# Patient Record
Sex: Female | Born: 2000 | Race: Black or African American | Hispanic: No | Marital: Single | Smoking: Former smoker
Health system: Southern US, Community
[De-identification: ages and names within clinical notes are randomized; demographics above are authoritative.]

## PROBLEM LIST (undated history)

## (undated) DIAGNOSIS — J45909 Unspecified asthma, uncomplicated: Secondary | ICD-10-CM

## (undated) DIAGNOSIS — E611 Iron deficiency: Secondary | ICD-10-CM

## (undated) DIAGNOSIS — S42309A Unspecified fracture of shaft of humerus, unspecified arm, initial encounter for closed fracture: Secondary | ICD-10-CM

## (undated) DIAGNOSIS — Z889 Allergy status to unspecified drugs, medicaments and biological substances status: Secondary | ICD-10-CM

## (undated) DIAGNOSIS — D649 Anemia, unspecified: Secondary | ICD-10-CM

## (undated) HISTORY — PX: ARM HARDWARE REMOVAL: SUR1122

---
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
## 2020-01-13 ENCOUNTER — Ambulatory Visit (INDEPENDENT_AMBULATORY_CARE_PROVIDER_SITE_OTHER): Payer: Medicaid Other | Admitting: Family Medicine

## 2020-01-13 ENCOUNTER — Other Ambulatory Visit: Payer: Self-pay

## 2020-01-13 ENCOUNTER — Other Ambulatory Visit (HOSPITAL_COMMUNITY)
Admission: RE | Admit: 2020-01-13 | Discharge: 2020-01-13 | Disposition: A | Discharge status: Home / Self Care | Payer: Medicaid Other | Source: Ambulatory Visit | Attending: Family Medicine | Admitting: Family Medicine

## 2020-01-13 VITALS — BP 106/72 | HR 108 | Wt 256.0 lb

## 2020-01-13 DIAGNOSIS — N898 Other specified noninflammatory disorders of vagina: Secondary | ICD-10-CM | POA: Insufficient documentation

## 2020-01-13 DIAGNOSIS — Z23 Encounter for immunization: Secondary | ICD-10-CM | POA: Diagnosis not present

## 2020-01-13 DIAGNOSIS — O0993 Supervision of high risk pregnancy, unspecified, third trimester: Secondary | ICD-10-CM

## 2020-01-13 DIAGNOSIS — O0990 Supervision of high risk pregnancy, unspecified, unspecified trimester: Secondary | ICD-10-CM

## 2020-01-13 DIAGNOSIS — Z3A28 28 weeks gestation of pregnancy: Secondary | ICD-10-CM

## 2020-01-13 LAB — CBC
Hematocrit: 33 % — ABNORMAL LOW (ref 34.0–46.6)
Hemoglobin: 11.5 g/dL (ref 11.1–15.9)
MCH: 31.7 pg (ref 26.6–33.0)
MCHC: 34.8 g/dL (ref 31.5–35.7)
MCV: 91 fL (ref 79–97)
Platelets: 253 10*3/uL (ref 150–450)
RBC: 3.63 x10E6/uL — ABNORMAL LOW (ref 3.77–5.28)
RDW: 12.2 % (ref 11.7–15.4)
WBC: 10.3 10*3/uL (ref 3.4–10.8)

## 2020-01-13 MED ORDER — PREPLUS 27-1 MG PO TABS: 1.0000 | ORAL_TABLET | Freq: Every day | ORAL | 13 refills | Status: DC

## 2020-01-13 NOTE — Progress Notes (Signed)
   PRENATAL VISIT NOTE  Subjective:  Briana Curry is a 19 y.o. G1P0 at [redacted]w[redacted]d being seen today for ongoing prenatal care.  She is currently monitored for the following issues for this low-risk pregnancy and has BMI 40.0-44.9, adult ([place]); Rash and other nonspecific skin eruption; Elevated blood pressure reading; Urticaria; Lactose intolerance; Adverse food reaction; Other allergic rhinitis; Obesity (BMI 30-39.9); Obesity in pregnancy; and Supervision of high risk pregnancy, antepartum on their problem list.  Patient reports vaginal irritation.  Contractions: Irregular. Vag. Bleeding: None.  Movement: Present. Denies leaking of fluid.   The following portions of the patient's history were reviewed and updated as appropriate: allergies, current medications, past family history, past medical history, past social history, past surgical history and problem list.   Objective:   Vitals:   01/13/20 0934  BP: 106/72  Pulse: (!) 108  Weight: 256 lb (116.1 kg)    Fetal Status: Fetal Heart Rate (bpm): 135 Fundal Height: 30 cm Movement: Present     General:  Alert, oriented and cooperative. Patient is in no acute distress.  Skin: Skin is warm and dry. No rash noted.   Cardiovascular: Normal heart rate noted  Respiratory: Normal respiratory effort, no problems with respiration noted  Abdomen: Soft, gravid, appropriate for gestational age.  Pain/Pressure: Present     Pelvic: Cervical exam deferred        Extremities: Normal range of motion.  Edema: None  Mental Status: Normal mood and affect. Normal behavior. Normal judgment and thought content.   Assessment and Plan:  Pregnancy: G1P0 at [redacted]w[redacted]d 1. Supervision of high risk pregnancy, antepartum 28 wk labs - Glucose Tolerance, 2 Hours w/1 Hour - RPR - CBC - HIV Antibody (routine testing w rflx) - Tdap vaccine greater than or equal to 7yo IM - Prenatal Vit-Fe Fumarate-FA (PREPLUS) 27-1 MG TABS; Take 1 tablet by mouth daily.  Dispense: 30  tablet; Refill: 13  2. Vaginal discharge Check and treat accordingly - Cervicovaginal ancillary only( [place])  Preterm labor symptoms and general obstetric precautions including but not limited to vaginal bleeding, contractions, leaking of fluid and fetal movement were reviewed in detail with the patient. Please refer to After Visit Summary for other counseling recommendations.   Return in 2 weeks (on 01/27/2020) for [facility], virtual.  Future Appointments  Date Time Provider [place]  01/26/2020  2:30 PM [facility] NURSE [facility]  01/26/2020  2:30 PM [facility] US3 [facility]    Donnamae Jude, MD

## 2020-01-13 NOTE — Patient Instructions (Addendum)
[email or web address] - website for childbirth classes  Breastfeeding  Choosing to breastfeed is one of the best decisions you can make for yourself and your baby. A change in hormones during pregnancy causes your breasts to make breast milk in your milk-producing glands. Hormones prevent breast milk from being released before your baby is born. They also prompt milk flow after birth. Once breastfeeding has begun, thoughts of your baby, as well as his or her sucking or crying, can stimulate the release of milk from your milk-producing glands. Benefits of breastfeeding Research shows that breastfeeding offers many health benefits for infants and mothers. It also offers a cost-free and convenient way to feed your baby. For your baby  Your first milk (colostrum) helps your baby's digestive system to function better.  Special cells in your milk (antibodies) help your baby to fight off infections.  Breastfed babies are less likely to develop asthma, allergies, obesity, or type 2 diabetes. They are also at lower risk for sudden infant death syndrome (SIDS).  Nutrients in breast milk are better able to meet your baby's needs compared to infant formula.  Breast milk improves your baby's brain development. For you  Breastfeeding helps to create a very special bond between you and your baby.  Breastfeeding is convenient. Breast milk costs nothing and is always available at the correct temperature.  Breastfeeding helps to burn calories. It helps you to lose the weight that you gained during pregnancy.  Breastfeeding makes your uterus return faster to its size before pregnancy. It also slows bleeding (lochia) after you give birth.  Breastfeeding helps to lower your risk of developing type 2 diabetes, osteoporosis, rheumatoid arthritis, cardiovascular disease, and breast, ovarian, uterine, and endometrial cancer later in life. Breastfeeding basics Starting breastfeeding  Find a comfortable place to  sit or lie down, with your neck and back well-supported.  Place a pillow or a rolled-up blanket under your baby to bring him or her to the level of your breast (if you are seated). Nursing pillows are specially designed to help support your arms and your baby while you breastfeed.  Make sure that your baby's tummy (abdomen) is facing your abdomen.  Gently massage your breast. With your fingertips, massage from the outer edges of your breast inward toward the nipple. This encourages milk flow. If your milk flows slowly, you may need to continue this action during the feeding.  Support your breast with 4 fingers underneath and your thumb above your nipple (make the letter "C" with your hand). Make sure your fingers are well away from your nipple and your baby's mouth.  Stroke your baby's lips gently with your finger or nipple.  When your baby's mouth is open wide enough, quickly bring your baby to your breast, placing your entire nipple and as much of the areola as possible into your baby's mouth. The areola is the colored area around your nipple. ? More areola should be visible above your baby's upper lip than below the lower lip. ? Your baby's lips should be opened and extended outward (flanged) to ensure an adequate, comfortable latch. ? Your baby's tongue should be between his or her lower gum and your breast.  Make sure that your baby's mouth is correctly positioned around your nipple (latched). Your baby's lips should create a seal on your breast and be turned out (everted).  It is common for your baby to suck about 2-3 minutes in order to start the flow of breast milk. Latching Teaching your baby how  to latch onto your breast properly is very important. An improper latch can cause nipple pain, decreased milk supply, and poor weight gain in your baby. Also, if your baby is not latched onto your nipple properly, he or she may swallow some air during feeding. This can make your baby fussy.  Burping your baby when you switch breasts during the feeding can help to get rid of the air. However, teaching your baby to latch on properly is still the best way to prevent fussiness from swallowing air while breastfeeding. Signs that your baby has successfully latched onto your nipple  Silent tugging or silent sucking, without causing you pain. Infant's lips should be extended outward (flanged).  Swallowing heard between every 3-4 sucks once your milk has started to flow (after your let-down milk reflex occurs).  Muscle movement above and in front of his or her ears while sucking. Signs that your baby has not successfully latched onto your nipple  Sucking sounds or smacking sounds from your baby while breastfeeding.  Nipple pain. If you think your baby has not latched on correctly, slip your finger into the corner of your baby's mouth to break the suction and place it between your baby's gums. Attempt to start breastfeeding again. Signs of successful breastfeeding Signs from your baby  Your baby will gradually decrease the number of sucks or will completely stop sucking.  Your baby will fall asleep.  Your baby's body will relax.  Your baby will retain a small amount of milk in his or her mouth.  Your baby will let go of your breast by himself or herself. Signs from you  Breasts that have increased in firmness, weight, and size 1-3 hours after feeding.  Breasts that are softer immediately after breastfeeding.  Increased milk volume, as well as a change in milk consistency and color by the fifth day of breastfeeding.  Nipples that are not sore, cracked, or bleeding. Signs that your baby is getting enough milk  Wetting at least 1-2 diapers during the first 24 hours after birth.  Wetting at least 5-6 diapers every 24 hours for the first week after birth. The urine should be clear or pale yellow by the age of 5 days.  Wetting 6-8 diapers every 24 hours as your baby continues to  grow and develop.  At least 3 stools in a 24-hour period by the age of 5 days. The stool should be soft and yellow.  At least 3 stools in a 24-hour period by the age of 7 days. The stool should be seedy and yellow.  No loss of weight greater than 10% of birth weight during the first 3 days of life.  Average weight gain of 4-7 oz (113-198 g) per week after the age of 4 days.  Consistent daily weight gain by the age of 5 days, without weight loss after the age of 2 weeks. After a feeding, your baby may spit up a small amount of milk. This is normal. Breastfeeding frequency and duration Frequent feeding will help you make more milk and can prevent sore nipples and extremely full breasts (breast engorgement). Breastfeed when you feel the need to reduce the fullness of your breasts or when your baby shows signs of hunger. This is called "breastfeeding on demand." Signs that your baby is hungry include:  Increased alertness, activity, or restlessness.  Movement of the head from side to side.  Opening of the mouth when the corner of the mouth or cheek is stroked (rooting).  Increased sucking sounds, smacking lips, cooing, sighing, or squeaking.  Hand-to-mouth movements and sucking on fingers or hands.  Fussing or crying. Avoid introducing a pacifier to your baby in the first 4-6 weeks after your baby is born. After this time, you may choose to use a pacifier. Research has shown that pacifier use during the first year of a baby's life decreases the risk of sudden infant death syndrome (SIDS). Allow your baby to feed on each breast as long as he or she wants. When your baby unlatches or falls asleep while feeding from the first breast, offer the second breast. Because newborns are often sleepy in the first few weeks of life, you may need to awaken your baby to get him or her to feed. Breastfeeding times will vary from baby to baby. However, the following rules can serve as a guide to help you make  sure that your baby is properly fed:  Newborns (babies 75 weeks of age or younger) may breastfeed every 1-3 hours.  Newborns should not go without breastfeeding for longer than 3 hours during the day or 5 hours during the night.  You should breastfeed your baby a minimum of 8 times in a 24-hour period. Breast milk pumping     Pumping and storing breast milk allows you to make sure that your baby is exclusively fed your breast milk, even at times when you are unable to breastfeed. This is especially important if you go back to work while you are still breastfeeding, or if you are not able to be present during feedings. Your lactation consultant can help you find a method of pumping that works best for you and give you guidelines about how long it is safe to store breast milk. Caring for your breasts while you breastfeed Nipples can become dry, cracked, and sore while breastfeeding. The following recommendations can help keep your breasts moisturized and healthy:  Avoid using soap on your nipples.  Wear a supportive bra designed especially for nursing. Avoid wearing underwire-style bras or extremely tight bras (sports bras).  Air-dry your nipples for 3-4 minutes after each feeding.  Use only cotton bra pads to absorb leaked breast milk. Leaking of breast milk between feedings is normal.  Use lanolin on your nipples after breastfeeding. Lanolin helps to maintain your skin's normal moisture barrier. Pure lanolin is not harmful (not toxic) to your baby. You may also hand express a few drops of breast milk and gently massage that milk into your nipples and allow the milk to air-dry. In the first few weeks after giving birth, some women experience breast engorgement. Engorgement can make your breasts feel heavy, warm, and tender to the touch. Engorgement peaks within 3-5 days after you give birth. The following recommendations can help to ease engorgement:  Completely empty your breasts while  breastfeeding or pumping. You may want to start by applying warm, moist heat (in the shower or with warm, water-soaked hand towels) just before feeding or pumping. This increases circulation and helps the milk flow. If your baby does not completely empty your breasts while breastfeeding, pump any extra milk after he or she is finished.  Apply ice packs to your breasts immediately after breastfeeding or pumping, unless this is too uncomfortable for you. To do this: ? Put ice in a plastic bag. ? Place a towel between your skin and the bag. ? Leave the ice on for 20 minutes, 2-3 times a day.  Make sure that your baby is latched on  and positioned properly while breastfeeding. If engorgement persists after 48 hours of following these recommendations, contact your health care provider or a Science writer. Overall health care recommendations while breastfeeding  Eat 3 healthy meals and 3 snacks every day. Well-nourished mothers who are breastfeeding need an additional 450-500 calories a day. You can meet this requirement by increasing the amount of a balanced diet that you eat.  Drink enough water to keep your urine pale yellow or clear.  Rest often, relax, and continue to take your prenatal vitamins to prevent fatigue, stress, and low vitamin and mineral levels in your body (nutrient deficiencies).  Do not use any products that contain nicotine or tobacco, such as cigarettes and e-cigarettes. Your baby may be harmed by chemicals from cigarettes that pass into breast milk and exposure to secondhand smoke. If you need help quitting, ask your health care provider.  Avoid alcohol.  Do not use illegal drugs or marijuana.  Talk with your health care provider before taking any medicines. These include over-the-counter and prescription medicines as well as vitamins and herbal supplements. Some medicines that may be harmful to your baby can pass through breast milk.  It is possible to become pregnant  while breastfeeding. If birth control is desired, ask your health care provider about options that will be safe while breastfeeding your baby. Where to find more information: [employer]: www.llli.org Contact a health care provider if:  You feel like you want to stop breastfeeding or have become frustrated with breastfeeding.  Your nipples are cracked or bleeding.  Your breasts are red, tender, or warm.  You have: ? Painful breasts or nipples. ? A swollen area on either breast. ? A fever or chills. ? Nausea or vomiting. ? Drainage other than breast milk from your nipples.  Your breasts do not become full before feedings by the fifth day after you give birth.  You feel sad and depressed.  Your baby is: ? Too sleepy to eat well. ? Having trouble sleeping. ? More than 42 week old and wetting fewer than 6 diapers in a 24-hour period. ? Not gaining weight by 78 days of age.  Your baby has fewer than 3 stools in a 24-hour period.  Your baby's skin or the white parts of his or her eyes become yellow. Get help right away if:  Your baby is overly tired (lethargic) and does not want to wake up and feed.  Your baby develops an unexplained fever. Summary  Breastfeeding offers many health benefits for infant and mothers.  Try to breastfeed your infant when he or she shows early signs of hunger.  Gently tickle or stroke your baby's lips with your finger or nipple to allow the baby to open his or her mouth. Bring the baby to your breast. Make sure that much of the areola is in your baby's mouth. Offer one side and burp the baby before you offer the other side.  Talk with your health care provider or lactation consultant if you have questions or you face problems as you breastfeed. This information is not intended to replace advice given to you by your health care provider. Make sure you discuss any questions you have with your health care provider. Document Revised:  10/30/2017 Document Reviewed: 09/06/2016 Elsevier Patient Education  [place].

## 2020-01-14 LAB — RPR: RPR Ser Ql: NONREACTIVE

## 2020-01-14 LAB — CERVICOVAGINAL ANCILLARY ONLY
Bacterial Vaginitis (gardnerella): POSITIVE — AB
Candida Glabrata: NEGATIVE
Candida Vaginitis: NEGATIVE
Chlamydia: NEGATIVE
Comment: NEGATIVE
Comment: NEGATIVE
Comment: NEGATIVE
Comment: NEGATIVE
Comment: NEGATIVE
Comment: NORMAL
Neisseria Gonorrhea: NEGATIVE
Trichomonas: NEGATIVE

## 2020-01-14 LAB — HIV ANTIBODY (ROUTINE TESTING W REFLEX): HIV Screen 4th Generation wRfx: NONREACTIVE

## 2020-01-14 LAB — GLUCOSE TOLERANCE, 2 HOURS W/ 1HR
Glucose, 1 hour: 113 mg/dL (ref 65–179)
Glucose, 2 hour: 78 mg/dL (ref 65–152)
Glucose, Fasting: 82 mg/dL (ref 65–91)

## 2020-01-14 MED ORDER — METRONIDAZOLE 500 MG PO TABS
500.0000 mg | ORAL_TABLET | Freq: Two times a day (BID) | ORAL | 0 refills | Status: DC
Start: 2020-01-14 — End: 2020-03-09

## 2020-01-14 NOTE — Addendum Note (Signed)
Addended by: Donnamae Jude on: 01/14/2020 02:42 PM   Modules accepted: Orders

## 2020-01-26 ENCOUNTER — Other Ambulatory Visit: Payer: Self-pay | Admitting: *Deleted

## 2020-01-26 ENCOUNTER — Ambulatory Visit: Payer: Medicaid Other | Admitting: *Deleted

## 2020-01-26 ENCOUNTER — Ambulatory Visit (HOSPITAL_COMMUNITY): Payer: Medicaid Other | Attending: Maternal & Fetal Medicine

## 2020-01-26 ENCOUNTER — Other Ambulatory Visit: Payer: Self-pay

## 2020-01-26 VITALS — BP 137/76 | HR 116

## 2020-01-26 DIAGNOSIS — O99213 Obesity complicating pregnancy, third trimester: Secondary | ICD-10-CM | POA: Diagnosis not present

## 2020-01-26 DIAGNOSIS — O99210 Obesity complicating pregnancy, unspecified trimester: Secondary | ICD-10-CM

## 2020-01-26 DIAGNOSIS — Z362 Encounter for other antenatal screening follow-up: Secondary | ICD-10-CM

## 2020-01-26 DIAGNOSIS — E669 Obesity, unspecified: Secondary | ICD-10-CM

## 2020-01-26 DIAGNOSIS — Z3A30 30 weeks gestation of pregnancy: Secondary | ICD-10-CM

## 2020-01-27 ENCOUNTER — Telehealth (INDEPENDENT_AMBULATORY_CARE_PROVIDER_SITE_OTHER): Payer: Medicaid Other | Admitting: Obstetrics and Gynecology

## 2020-01-27 ENCOUNTER — Telehealth: Payer: Self-pay | Admitting: *Deleted

## 2020-01-27 DIAGNOSIS — Z91199 Patient's noncompliance with other medical treatment and regimen due to unspecified reason: Secondary | ICD-10-CM

## 2020-01-27 DIAGNOSIS — Z5329 Procedure and treatment not carried out because of patient's decision for other reasons: Secondary | ICD-10-CM

## 2020-01-27 NOTE — Progress Notes (Signed)
I connected with  Briana Curry on 01/27/20 at  3:15 PM EDT by telephone and verified that I am speaking with the correct person using two identifiers.   I discussed the limitations, risks, security and privacy concerns of performing an evaluation and management service by telephone and the availability of in person appointments. I also discussed with the patient that there may be a patient responsible charge related to this service. The patient expressed understanding and agreed to proceed.  Crosby Oyster, RN 01/27/2020  3:16 PM

## 2020-01-27 NOTE — Telephone Encounter (Signed)
Called pt to help get video visit started, left message for pt to call office

## 2020-01-31 NOTE — Progress Notes (Signed)
Patient did not keep her televisit OB appointment for 01/27/2020. Patient did not appear on MyChart and she was called but there was no answer when called by the doctor. We will attempt to reschedule her visit.   Durene Romans MD Attending Center for [employer] farm manager)

## 2020-02-10 ENCOUNTER — Other Ambulatory Visit: Payer: Self-pay

## 2020-02-10 ENCOUNTER — Telehealth: Payer: Self-pay | Admitting: Radiology

## 2020-02-10 ENCOUNTER — Telehealth (INDEPENDENT_AMBULATORY_CARE_PROVIDER_SITE_OTHER): Payer: Medicaid Other | Admitting: Obstetrics and Gynecology

## 2020-02-10 DIAGNOSIS — O99212 Obesity complicating pregnancy, second trimester: Secondary | ICD-10-CM

## 2020-02-10 DIAGNOSIS — Z3A32 32 weeks gestation of pregnancy: Secondary | ICD-10-CM

## 2020-02-10 DIAGNOSIS — E669 Obesity, unspecified: Secondary | ICD-10-CM

## 2020-02-10 DIAGNOSIS — Z6841 Body Mass Index (BMI) 40.0 and over, adult: Secondary | ICD-10-CM

## 2020-02-10 DIAGNOSIS — O0992 Supervision of high risk pregnancy, unspecified, second trimester: Secondary | ICD-10-CM

## 2020-02-10 DIAGNOSIS — O99210 Obesity complicating pregnancy, unspecified trimester: Secondary | ICD-10-CM

## 2020-02-10 DIAGNOSIS — O23592 Infection of other part of genital tract in pregnancy, second trimester: Secondary | ICD-10-CM

## 2020-02-10 DIAGNOSIS — N760 Acute vaginitis: Secondary | ICD-10-CM

## 2020-02-10 DIAGNOSIS — O0990 Supervision of high risk pregnancy, unspecified, unspecified trimester: Secondary | ICD-10-CM

## 2020-02-10 MED ORDER — MICONAZOLE NITRATE 2 % VA CREA: 1.0000 | TOPICAL_CREAM | Freq: Every day | VAGINAL | 2 refills | Status: DC

## 2020-02-10 NOTE — Telephone Encounter (Signed)
Left patient voicemail and mychart message that next appointment on 07/08/521 @ 2:00 will be in person rather than mychart per Dr Ilda Basset

## 2020-02-10 NOTE — Progress Notes (Signed)
   TELEHEALTH VIRTUAL OBSTETRICS VISIT ENCOUNTER NOTE  Clinic: [facility]  I connected with Briana Curry on 02/10/20 at 11:00 AM EDT by telephone at home and verified that I am speaking with the correct person using two identifiers.   I discussed the limitations, risks, security and privacy concerns of performing an evaluation and management service by telephone and the availability of in person appointments. I also discussed with the patient that there may be a patient responsible charge related to this service. The patient expressed understanding and agreed to proceed.  Subjective:  Briana Curry is a 19 y.o. G1P0 at [redacted]w[redacted]d being followed for ongoing prenatal care.  She is currently monitored for the following issues for this high-risk pregnancy and has BMI 40.0-44.9, adult ([place]); Rash and other nonspecific skin eruption; Elevated blood pressure reading; Urticaria; Lactose intolerance; Adverse food reaction; Other allergic rhinitis; Obesity (BMI 30-39.9); Obesity in pregnancy; and Supervision of high risk pregnancy, antepartum on their problem list.  Patient reports no complaints. Reports fetal movement. Denies any contractions, bleeding or leaking of fluid.   The following portions of the patient's history were reviewed and updated as appropriate: allergies, current medications, past family history, past medical history, past social history, past surgical history and problem list.   Objective:  There were no vitals filed for this visit.  Babyscripts Data Reviewed: not applicable  General:  Alert, oriented and cooperative.   Mental Status: Normal mood and affect perceived. Normal judgment and thought content.  Rest of physical exam deferred due to type of encounter  Assessment and Plan:  Pregnancy: G1P0 at [redacted]w[redacted]d 1. Supervision of high risk pregnancy, antepartum Routine care. Pt unable to check BPs at home. I d/w her need for in person visits. I told her that  she can stop by anytime before her next visit to check bp and weight. Pre-eclampsia risks d/w her  2. Obesity in pregnancy  3. Obesity (BMI 30-39.9)  4. BMI 40.0-44.9, adult ([place])  5. Acute vaginitis Monistat 7 sent in  Preterm labor symptoms and general obstetric precautions including but not limited to vaginal bleeding, contractions, leaking of fluid and fetal movement were reviewed in detail with the patient.  I discussed the assessment and treatment plan with the patient. The patient was provided an opportunity to ask questions and all were answered. The patient agreed with the plan and demonstrated an understanding of the instructions. The patient was advised to call back or seek an in-person office evaluation/go to [facility] at [facility] for any urgent or concerning symptoms. Please refer to After Visit Summary for other counseling recommendations.   I provided 10 minutes of non-face-to-face time during this encounter. The visit was conducted via Mychart-medicine  Return in about 1 week (around 02/17/2020) for 10-14d , in person, low risk.  Future Appointments  Date Time Provider [place]  02/24/2020  2:00 PM Aletha Halim, MD [facility]  02/24/2020  2:45 PM [facility] US5 [facility]  03/09/2020  2:00 PM Anyanwu, Sallyanne Havers, MD [facility]    Aletha Halim, MD Center for [facility] [place]

## 2020-02-10 NOTE — Progress Notes (Signed)
I connected with  Briana Curry on 02/10/20 at 11:00 AM EDT by telephone and verified that I am speaking with the correct person using two identifiers.   I discussed the limitations, risks, security and privacy concerns of performing an evaluation and management service by telephone and the availability of in person appointments. I also discussed with the patient that there may be a patient responsible charge related to this service. The patient expressed understanding and agreed to proceed.  Saki Legore Jeanella Anton, CMA 02/10/2020  10:56 AM

## 2020-02-24 ENCOUNTER — Encounter: Payer: Medicaid Other | Admitting: Obstetrics and Gynecology

## 2020-02-24 ENCOUNTER — Other Ambulatory Visit: Payer: Self-pay

## 2020-02-24 ENCOUNTER — Encounter (HOSPITAL_COMMUNITY): Payer: Self-pay | Admitting: Obstetrics & Gynecology

## 2020-02-24 ENCOUNTER — Ambulatory Visit: Payer: Medicaid Other | Attending: Obstetrics and Gynecology

## 2020-02-24 ENCOUNTER — Encounter: Payer: Self-pay | Admitting: Obstetrics and Gynecology

## 2020-02-24 ENCOUNTER — Inpatient Hospital Stay (HOSPITAL_COMMUNITY)
Admission: AD | Admit: 2020-02-24 | Discharge: 2020-02-24 | Disposition: A | Discharge status: Home / Self Care | Payer: Medicaid Other | Attending: Obstetrics & Gynecology | Admitting: Obstetrics & Gynecology

## 2020-02-24 DIAGNOSIS — Z79899 Other long term (current) drug therapy: Secondary | ICD-10-CM | POA: Insufficient documentation

## 2020-02-24 DIAGNOSIS — J45909 Unspecified asthma, uncomplicated: Secondary | ICD-10-CM | POA: Insufficient documentation

## 2020-02-24 DIAGNOSIS — R109 Unspecified abdominal pain: Secondary | ICD-10-CM

## 2020-02-24 DIAGNOSIS — O99213 Obesity complicating pregnancy, third trimester: Secondary | ICD-10-CM | POA: Diagnosis not present

## 2020-02-24 DIAGNOSIS — O26893 Other specified pregnancy related conditions, third trimester: Secondary | ICD-10-CM | POA: Insufficient documentation

## 2020-02-24 DIAGNOSIS — O99210 Obesity complicating pregnancy, unspecified trimester: Secondary | ICD-10-CM | POA: Insufficient documentation

## 2020-02-24 DIAGNOSIS — E669 Obesity, unspecified: Secondary | ICD-10-CM

## 2020-02-24 DIAGNOSIS — Z3A01 Less than 8 weeks gestation of pregnancy: Secondary | ICD-10-CM

## 2020-02-24 DIAGNOSIS — O283 Abnormal ultrasonic finding on antenatal screening of mother: Secondary | ICD-10-CM

## 2020-02-24 DIAGNOSIS — Z87891 Personal history of nicotine dependence: Secondary | ICD-10-CM | POA: Diagnosis not present

## 2020-02-24 DIAGNOSIS — Z3A34 34 weeks gestation of pregnancy: Secondary | ICD-10-CM

## 2020-02-24 DIAGNOSIS — Z7982 Long term (current) use of aspirin: Secondary | ICD-10-CM | POA: Diagnosis not present

## 2020-02-24 DIAGNOSIS — M7918 Myalgia, other site: Secondary | ICD-10-CM | POA: Diagnosis not present

## 2020-02-24 DIAGNOSIS — O99513 Diseases of the respiratory system complicating pregnancy, third trimester: Secondary | ICD-10-CM | POA: Diagnosis not present

## 2020-02-24 DIAGNOSIS — Z362 Encounter for other antenatal screening follow-up: Secondary | ICD-10-CM | POA: Diagnosis not present

## 2020-02-24 LAB — COMPREHENSIVE METABOLIC PANEL
ALT: 15 U/L (ref 0–44)
AST: 19 U/L (ref 15–41)
Albumin: 2.8 g/dL — ABNORMAL LOW (ref 3.5–5.0)
Alkaline Phosphatase: 155 U/L — ABNORMAL HIGH (ref 38–126)
Anion gap: 9 (ref 5–15)
BUN: 6 mg/dL (ref 6–20)
CO2: 23 mmol/L (ref 22–32)
Calcium: 9.1 mg/dL (ref 8.9–10.3)
Chloride: 105 mmol/L (ref 98–111)
Creatinine, Ser: 0.62 mg/dL (ref 0.44–1.00)
GFR calc Af Amer: >60 mL/min (ref >60)
GFR calc non Af Amer: >60 mL/min (ref >60)
Glucose, Bld: 101 mg/dL — ABNORMAL HIGH (ref 70–99)
Potassium: 4 mmol/L (ref 3.5–5.1)
Sodium: 137 mmol/L (ref 135–145)
Total Bilirubin: 0.5 mg/dL (ref 0.3–1.2)
Total Protein: 6.6 g/dL (ref 6.5–8.1)

## 2020-02-24 LAB — CBC
HCT: 31.4 % — ABNORMAL LOW (ref 36.0–46.0)
Hemoglobin: 10.4 g/dL — ABNORMAL LOW (ref 12.0–15.0)
MCH: 31 pg (ref 26.0–34.0)
MCHC: 33.1 g/dL (ref 30.0–36.0)
MCV: 93.5 fL (ref 80.0–100.0)
Platelets: 216 10*3/uL (ref 150–400)
RBC: 3.36 MIL/uL — ABNORMAL LOW (ref 3.87–5.11)
RDW: 13.3 % (ref 11.5–15.5)
WBC: 9.8 10*3/uL (ref 4.0–10.5)
nRBC: 0 % (ref 0.0–0.2)

## 2020-02-24 LAB — URINALYSIS, ROUTINE W REFLEX MICROSCOPIC
Bilirubin Urine: NEGATIVE
Glucose, UA: NEGATIVE mg/dL
Hgb urine dipstick: NEGATIVE
Ketones, ur: 5 mg/dL — AB
Leukocytes,Ua: NEGATIVE
Nitrite: NEGATIVE
Protein, ur: 30 mg/dL — AB
Specific Gravity, Urine: 1.019 (ref 1.005–1.030)
pH: 6 (ref 5.0–8.0)

## 2020-02-24 LAB — WET PREP, GENITAL
Clue Cells Wet Prep HPF POC: NONE SEEN
Sperm: NONE SEEN
Trich, Wet Prep: NONE SEEN
Yeast Wet Prep HPF POC: NONE SEEN

## 2020-02-24 LAB — LIPASE, BLOOD: Lipase: 20 U/L (ref 11–51)

## 2020-02-24 MED ORDER — COMFORT FIT MATERNITY SUPP MED MISC
1.0000 | Freq: Every day | 0 refills | Status: DC
Start: 2020-02-24 — End: 2020-03-13

## 2020-02-24 MED ORDER — TRIAMCINOLONE ACETONIDE 0.5 % EX OINT
1.0000 | TOPICAL_OINTMENT | Freq: Two times a day (BID) | CUTANEOUS | 0 refills | Status: DC
Start: 2020-02-24 — End: 2020-03-13

## 2020-02-24 MED ORDER — PRENATAL VITAMIN 27-0.8 MG PO TABS: 1.0000 | ORAL_TABLET | Freq: Every day | ORAL | 5 refills | Status: DC

## 2020-02-24 MED ORDER — NYSTATIN 100000 UNIT/GM EX CREA
1.0000 | TOPICAL_CREAM | Freq: Two times a day (BID) | CUTANEOUS | 1 refills | Status: DC
Start: 2020-02-24 — End: 2020-03-13

## 2020-02-24 NOTE — MAU Note (Signed)
Been having real bad pains in the side and at the bottom of her stomach. Started "this week", wakes her up out of her sleep. Also has a "rash down there", first noted like a week or two ago. Itching. (missed last appt)

## 2020-02-24 NOTE — MAU Provider Note (Signed)
Chief Complaint:  Abdominal Pain and Vaginal Itching   First Provider Initiated Contact with Patient 02/24/20 1849      HPI: Briana Curry is a 19 y.o. G1P0 at 14w1dby early ultrasound who presents to maternity admissions reporting abdominal pain in her upper and lower abdomen 2-3 times per hour and an itchy rash of the vaginal area x 1 week. She reports good fetal movement.   Location: upper and lower abdomen Quality: cramping Severity: 10/10 on pain scale Duration: 3 days Timing: intermittent, 2-3 times per hour Modifying factors: none Associated signs and symptoms: vaginal rash  HPI  Past Medical History: Past Medical History:  Diagnosis Date  . Anemia   . Asthma   . Broken arm   . Iron deficiency   . Multiple allergies     Past obstetric history: OB History  Gravida Para Term Preterm AB Living  1         0  SAB TAB Ectopic Multiple Live Births               # Outcome Date GA Lbr Len/2nd Weight Sex Delivery Anes PTL Lv  1 Current             Past Surgical History: Past Surgical History:  Procedure Laterality Date  . ARM HARDWARE REMOVAL      Family History: Family History  Problem Relation Age of Onset  . Asthma Mother   . Asthma Sister   . Eczema Sister   . Urticaria Sister   . Asthma Brother   . Eczema Brother     Social History: Social History   Tobacco Use  . Smoking status: Former Smoker    Types: Cigars    Quit date: 07/14/2019    Years since quitting: 0.6  . Smokeless tobacco: Never Used  Vaping Use  . Vaping Use: Never used  Substance Use Topics  . Alcohol use: Not Currently  . Drug use: No    Allergies:  Allergies  Allergen Reactions  . Lactose Intolerance (Gi)   . Lactose Intolerance (Gi)   . Other Rash    [employer] sweet and sour sauce caused rash and seafood    Meds:  Medications Prior to Admission  Medication Sig Dispense Refill Last Dose  . [DISCONTINUED] Prenatal Vit-Fe Fumarate-FA (PRENATAL VITAMIN) 27-0.8 MG  TABS Take 1 tablet by mouth daily. 30 tablet 11 02/24/2020 at Unknown time  . acetaminophen (TYLENOL) 500 MG tablet Take 2 tablets (1,000 mg total) by mouth every 6 (six) hours as needed for moderate pain or headache. (Patient not taking: Reported on 01/13/2020) 100 tablet 3   . aspirin EC 81 MG tablet Take 1 tablet (81 mg total) by mouth daily. Take after 12 weeks for prevention of preeclampsia later in pregnancy (Patient not taking: Reported on 12/15/2019) 30 tablet 2   . Blood Pressure KIT 1 Device by Does not apply route once a week. To be monitored weekly from (Patient not taking: Reported on 02/10/2020) 1 kit 0   . Blood Pressure Monitor KIT Please check blood pressure 1-2 times per week. (Patient not taking: Reported on 02/10/2020) 1 kit 0   . cetirizine (ZYRTEC ALLERGY) 10 MG tablet Take 1 tablet (10 mg total) by mouth daily. (Patient not taking: Reported on 01/13/2020) 30 tablet 3   . cyclobenzaprine (FLEXERIL) 10 MG tablet Take 1 tablet (10 mg total) by mouth 2 (two) times daily as needed for muscle spasms. (Patient not taking: Reported on 12/15/2019) 20  tablet 0   . metroNIDAZOLE (FLAGYL) 500 MG tablet Take 1 tablet (500 mg total) by mouth 2 (two) times daily. (Patient not taking: Reported on 01/26/2020) 14 tablet 0   . miconazole (MONISTAT 7) 2 % vaginal cream Place 1 Applicatorful vaginally at bedtime. Apply for seven nights 30 g 2   . Prenatal MV & Min w/FA-DHA (PRENATAL ADULT GUMMY/DHA/FA) 0.4-25 MG CHEW Chew 1 each by mouth daily. (Patient not taking: Reported on 02/10/2020) 30 tablet 3   . Prenatal Vit-Fe Fumarate-FA (PREPLUS) 27-1 MG TABS Take 1 tablet by mouth daily. (Patient not taking: Reported on 02/10/2020) 30 tablet 13   . pseudoephedrine (SUDAFED) 60 MG tablet Take 1 tablet (60 mg total) by mouth every 6 (six) hours as needed for congestion. (Patient not taking: Reported on 01/13/2020) 20 tablet 0   . [DISCONTINUED] Elastic Bandages & Supports (COMFORT FIT MATERNITY SUPP MED) MISC 1 Device by  Does not apply route daily. (Patient not taking: Reported on 02/10/2020) 1 each 0     ROS:  Review of Systems  Constitutional: Negative for chills, fatigue and fever.  Eyes: Negative for visual disturbance.  Respiratory: Negative for shortness of breath.   Cardiovascular: Negative for chest pain.  Gastrointestinal: Positive for abdominal pain. Negative for nausea and vomiting.  Genitourinary: Negative for difficulty urinating, dysuria, flank pain, pelvic pain, vaginal bleeding, vaginal discharge and vaginal pain.  Skin: Positive for rash.  Neurological: Negative for dizziness and headaches.  Psychiatric/Behavioral: Negative.      I have reviewed patient's Past Medical Hx, Surgical Hx, Family Hx, Social Hx, medications and allergies.   Physical Exam   Patient Vitals for the past 24 hrs:  BP Temp Temp src Pulse Resp SpO2 Height Weight  02/24/20 1903 123/72 -- -- 97 18 -- -- --  02/24/20 1722 139/80 98.6 F (37 C) Oral (!) 106 18 98 % 5' 3"  (1.6 m) 119.6 kg   Constitutional: Well-developed, well-nourished female in no acute distress.  Cardiovascular: normal rate Respiratory: normal effort GI: Abd soft, non-tender, gravid appropriate for gestational age.  MS: Extremities nontender, no edema, normal ROM Neurologic: Alert and oriented x 4.  GU: Neg CVAT.  PELVIC EXAM: Cervix pink, visually closed, without lesion, small amount of thin white discharge, vaginal walls normal and external genitalia with mild erythema, flaking and white hazy appearance of external labia and inner thigh area  Dilation: Closed Effacement (%): Thick Exam by:: Fatima Blank, CNM  FHT:  Baseline 135 , moderate variability, accelerations present, no decelerations Contractions: None on toco or to palpation   Labs: Results for orders placed or performed during the hospital encounter of 02/24/20 (from the past 24 hour(s))  Urinalysis, Routine w reflex microscopic     Status: Abnormal   Collection Time:  02/24/20  5:49 PM  Result Value Ref Range   Color, Urine YELLOW YELLOW   APPearance HAZY (A) CLEAR   Specific Gravity, Urine 1.019 1.005 - 1.030   pH 6.0 5.0 - 8.0   Glucose, UA NEGATIVE NEGATIVE mg/dL   Hgb urine dipstick NEGATIVE NEGATIVE   Bilirubin Urine NEGATIVE NEGATIVE   Ketones, ur 5 (A) NEGATIVE mg/dL   Protein, ur 30 (A) NEGATIVE mg/dL   Nitrite NEGATIVE NEGATIVE   Leukocytes,Ua NEGATIVE NEGATIVE   RBC / HPF 0-5 0 - 5 RBC/hpf   WBC, UA 0-5 0 - 5 WBC/hpf   Bacteria, UA RARE (A) NONE SEEN   Squamous Epithelial / LPF 0-5 0 - 5   Mucus  PRESENT   Wet prep, genital     Status: Abnormal   Collection Time: 02/24/20  6:54 PM  Result Value Ref Range   Yeast Wet Prep HPF POC NONE SEEN NONE SEEN   Trich, Wet Prep NONE SEEN NONE SEEN   Clue Cells Wet Prep HPF POC NONE SEEN NONE SEEN   WBC, Wet Prep HPF POC MODERATE (A) NONE SEEN   Sperm NONE SEEN   CBC     Status: Abnormal   Collection Time: 02/24/20  7:15 PM  Result Value Ref Range   WBC 9.8 4.0 - 10.5 K/uL   RBC 3.36 (L) 3.87 - 5.11 MIL/uL   Hemoglobin 10.4 (L) 12.0 - 15.0 g/dL   HCT 31.4 (L) 36 - 46 %   MCV 93.5 80.0 - 100.0 fL   MCH 31.0 26.0 - 34.0 pg   MCHC 33.1 30.0 - 36.0 g/dL   RDW 13.3 11.5 - 15.5 %   Platelets 216 150 - 400 K/uL   nRBC 0.0 0.0 - 0.2 %  Comprehensive metabolic panel     Status: Abnormal   Collection Time: 02/24/20  7:15 PM  Result Value Ref Range   Sodium 137 135 - 145 mmol/L   Potassium 4.0 3.5 - 5.1 mmol/L   Chloride 105 98 - 111 mmol/L   CO2 23 22 - 32 mmol/L   Glucose, Bld 101 (H) 70 - 99 mg/dL   BUN 6 6 - 20 mg/dL   Creatinine, Ser 0.62 0.44 - 1.00 mg/dL   Calcium 9.1 8.9 - 10.3 mg/dL   Total Protein 6.6 6.5 - 8.1 g/dL   Albumin 2.8 (L) 3.5 - 5.0 g/dL   AST 19 15 - 41 U/L   ALT 15 0 - 44 U/L   Alkaline Phosphatase 155 (H) 38 - 126 U/L   Total Bilirubin 0.5 0.3 - 1.2 mg/dL   GFR calc non Af Amer >60 >60 mL/min   GFR calc Af Amer >60 >60 mL/min   Anion gap 9 5 - 15  Lipase, blood      Status: None   Collection Time: 02/24/20  7:15 PM  Result Value Ref Range   Lipase 20 11 - 51 U/L   A/Positive/-- (02/04 1616)  Imaging:    MAU Course/MDM: Orders Placed This Encounter  Procedures  . Wet prep, genital  . Urinalysis, Routine w reflex microscopic  . CBC  . Comprehensive metabolic panel  . Lipase, blood  . Discharge patient    Meds ordered this encounter  Medications  . Elastic Bandages & Supports (COMFORT FIT MATERNITY SUPP MED) MISC    Sig: 1 Device by Does not apply route daily.    Dispense:  1 each    Refill:  0    Order Specific Question:   Supervising Provider    Answer:   Lynnda Shields A [[id number]]  . nystatin cream (MYCOSTATIN)    Sig: Apply 1 application topically 2 (two) times daily.    Dispense:  30 g    Refill:  1    Order Specific Question:   Supervising Provider    Answer:   Lynnda Shields A [[id number]]  . triamcinolone ointment (KENALOG) 0.5 %    Sig: Apply 1 application topically 2 (two) times daily.    Dispense:  30 g    Refill:  0    Order Specific Question:   Supervising Provider    Answer:   Lynnda Shields A [[id number]]  . Prenatal Vit-Fe Fumarate-FA (PRENATAL VITAMIN)  27-0.8 MG TABS    Sig: Take 1 tablet by mouth daily.    Dispense:  30 tablet    Refill:  5    Order Specific Question:   Supervising Provider    Answer:   Lynnda Shields A [[id number]]     NST reviewed and reactive No evidence of preterm labor with closed cervix No acute abdomen on exam Pt pain is c/w fetal movements, likely musculoskeletal pain Rest/ice/heat/warm bath/Tylenol/pregnancy support belt Rx reprinted for pregnancy support belt, pt to pick up tomorrow Tx for candida of external labia/groin area with nystatin cream plus triamcinolone cream for discomfort/itching STD testing today on pt request, wet prep is negative  F/U in office as scheduled, return to MAU as needed for emergencies   Assessment: 1. Abdominal pain during pregnancy in third trimester    2. Musculoskeletal pain   3. Less than [redacted] weeks gestation of pregnancy     Plan: Discharge home Labor precautions and fetal kick counts  Follow-up [place] [facility] [place] [facility] up.   Specialty: Obstetrics and Gynecology Why: As scheduled, return to MAU as needed for emergencies.  Contact information: [place] [phone]             Allergies as of 02/24/2020      Reactions   Lactose Intolerance (gi)    Lactose Intolerance (gi)    Other Rash   [employer] sweet and sour sauce caused rash and seafood      Medication List    TAKE these medications   acetaminophen 500 MG tablet Commonly known as: TYLENOL Take 2 tablets (1,000 mg total) by mouth every 6 (six) hours as needed for moderate pain or headache.   aspirin EC 81 MG tablet Take 1 tablet (81 mg total) by mouth daily. Take after 12 weeks for prevention of preeclampsia later in pregnancy   Blood Pressure Monitor Kit Please check blood pressure 1-2 times per week.   Blood Pressure Kit 1 Device by Does not apply route once a week. To be monitored weekly from   cetirizine 10 MG tablet Commonly known as: ZyrTEC Allergy Take 1 tablet (10 mg total) by mouth daily.   Comfort Fit Maternity Supp Med Misc 1 Device by Does not apply route daily.   cyclobenzaprine 10 MG tablet Commonly known as: FLEXERIL Take 1 tablet (10 mg total) by mouth 2 (two) times daily as needed for muscle spasms.   metroNIDAZOLE 500 MG tablet Commonly known as: FLAGYL Take 1 tablet (500 mg total) by mouth 2 (two) times daily.   miconazole 2 % vaginal cream Commonly known as: MONISTAT 7 Place 1 Applicatorful vaginally at bedtime. Apply for seven nights   nystatin cream Commonly known as: MYCOSTATIN Apply 1 application topically 2 (two) times daily.   Prenatal Adult Gummy/DHA/FA 0.4-25 MG Chew Chew 1 each by mouth daily.   PrePLUS 27-1 MG Tabs Take 1  tablet by mouth daily.   Prenatal Vitamin 27-0.8 MG Tabs Take 1 tablet by mouth daily.   pseudoephedrine 60 MG tablet Commonly known as: SUDAFED Take 1 tablet (60 mg total) by mouth every 6 (six) hours as needed for congestion.   triamcinolone ointment 0.5 % Commonly known as: KENALOG Apply 1 application topically 2 (two) times daily.       Fatima Blank Certified Nurse-Midwife 02/24/2020 8:58 PM

## 2020-02-24 NOTE — Discharge Instructions (Signed)
Reasons to return to MAU if you are before 37 weeks:  1.  Contractions are  10-15 minutes apart or less, each last longer than 30 seconds, these have been going on for 1-2 hours 2.  You have a large gush of fluid, or a trickle of fluid that will not stop and you have to wear a pad 3.  You have bleeding that is bright red, heavier than spotting--like menstrual bleeding (spotting can be normal in early labor or after a check of your cervix) 4.  You do not feel the baby moving like he/she normally does     PREGNANCY SUPPORT BELT: You are not alone, Seventy-five percent of women have some sort of abdominal or back pain at some point in their pregnancy. Your baby is growing at a fast pace, which means that your whole body is rapidly trying to adjust to the changes. As your uterus grows, your back may start feeling a bit under stress and this can result in back or abdominal pain that can go from mild, and therefore bearable, to severe pains that will not allow you to sit or lay down comfortably, When it comes to dealing with pregnancy-related pains and cramps, some pregnant women usually prefer natural remedies, which the market is filled with nowadays. For example, wearing a pregnancy support belt can help ease and lessen your discomfort and pain. WHAT ARE THE BENEFITS OF WEARING A PREGNANCY SUPPORT BELT? A pregnancy support belt provides support to the lower portion of the belly taking some of the weight of the growing uterus and distributing to the other parts of your body. It is designed make you comfortable and gives you extra support. Over the years, the pregnancy apparel market has been studying the needs and wants of pregnant women and they have come up with the most comfortable pregnancy support belts that woman could ever ask for. In fact, you will no longer have to wear a stretched-out or bulky pregnancy belt that is visible underneath your clothes and makes you feel even more uncomfortable.  Nowadays, a pregnancy support belt is made of comfortable and stretchy materials that will not irritate your skin but will actually make you feel at ease and you will not even notice you are wearing it. They are easy to put on and adjust during the day and can be worn at night for additional support.  BENEFITS: . Relives Back pain . Relieves Abdominal Muscle and Leg Pain . Stabilizes the Pelvic Ring . Offers a Cushioned Abdominal Lift Pad . Relieves pressure on the Sciatic Nerve Within Minutes   WHERE TO GET YOUR PREGNANCY BELT: [employer] [phone]  [place], [place] [address]

## 2020-02-25 LAB — GC/CHLAMYDIA PROBE AMP (~~LOC~~) NOT AT ARMC
Chlamydia: NEGATIVE
Comment: NEGATIVE
Comment: NORMAL
Neisseria Gonorrhea: NEGATIVE

## 2020-02-25 NOTE — Progress Notes (Signed)
Patient did not keep her OB appointment for 02/24/2020.  Durene Romans MD Attending Center for [employer])

## 2020-03-09 ENCOUNTER — Encounter: Payer: Medicaid Other | Admitting: Obstetrics & Gynecology

## 2020-03-13 ENCOUNTER — Ambulatory Visit (INDEPENDENT_AMBULATORY_CARE_PROVIDER_SITE_OTHER): Payer: Medicaid Other | Admitting: Obstetrics & Gynecology

## 2020-03-13 ENCOUNTER — Encounter: Payer: Self-pay | Admitting: Obstetrics & Gynecology

## 2020-03-13 ENCOUNTER — Other Ambulatory Visit: Payer: Self-pay

## 2020-03-13 ENCOUNTER — Other Ambulatory Visit (HOSPITAL_COMMUNITY)
Admission: RE | Admit: 2020-03-13 | Discharge: 2020-03-13 | Disposition: A | Discharge status: Home / Self Care | Payer: Medicaid Other | Source: Ambulatory Visit | Attending: Obstetrics & Gynecology | Admitting: Obstetrics & Gynecology

## 2020-03-13 VITALS — BP 129/70 | HR 72 | Wt 258.4 lb

## 2020-03-13 DIAGNOSIS — Z01419 Encounter for gynecological examination (general) (routine) without abnormal findings: Secondary | ICD-10-CM | POA: Insufficient documentation

## 2020-03-13 DIAGNOSIS — O26843 Uterine size-date discrepancy, third trimester: Secondary | ICD-10-CM

## 2020-03-13 DIAGNOSIS — Z3A36 36 weeks gestation of pregnancy: Secondary | ICD-10-CM | POA: Diagnosis present

## 2020-03-13 DIAGNOSIS — O09893 Supervision of other high risk pregnancies, third trimester: Secondary | ICD-10-CM

## 2020-03-13 DIAGNOSIS — B3731 Acute candidiasis of vulva and vagina: Secondary | ICD-10-CM

## 2020-03-13 DIAGNOSIS — O99213 Obesity complicating pregnancy, third trimester: Secondary | ICD-10-CM

## 2020-03-13 DIAGNOSIS — O23593 Infection of other part of genital tract in pregnancy, third trimester: Secondary | ICD-10-CM

## 2020-03-13 MED ORDER — PRENATAL VITAMIN 27-0.8 MG PO TABS: 1.0000 | ORAL_TABLET | Freq: Every day | ORAL | 5 refills | Status: DC

## 2020-03-13 MED ORDER — POLYSACCHARIDE IRON COMPLEX 150 MG PO CAPS: 150.0000 mg | ORAL_CAPSULE | Freq: Every day | ORAL | 3 refills | Status: DC

## 2020-03-13 NOTE — Patient Instructions (Signed)
Return to office for any scheduled appointments. Call the office or go to the MAU at [facility] [place] if:  You begin to have strong, frequent contractions  Your water breaks.  Sometimes it is a big gush of fluid, sometimes it is just a trickle that keeps getting your panties wet or running down your legs  You have vaginal bleeding.  It is normal to have a small amount of spotting if your cervix was checked.   You do not feel your baby moving like normal.  If you do not, get something to eat and drink and lay down and focus on feeling your baby move.   If your baby is still not moving like normal, you should call the office or go to MAU.  Any other obstetric concerns.   

## 2020-03-13 NOTE — Progress Notes (Signed)
PRENATAL VISIT NOTE  Subjective:  Briana Curry is a 19 y.o. G1P0 at [redacted]w[redacted]d being seen today for ongoing prenatal care.  She is currently monitored for the following issues for this high-risk pregnancy and has BMI 40.0-44.9, adult ([place]); Rash and other nonspecific skin eruption; Elevated blood pressure reading; Urticaria; Lactose intolerance; Adverse food reaction; Other allergic rhinitis; Obesity (BMI 30-39.9); Obesity in pregnancy; and Supervision of high risk pregnancy, antepartum on their problem list.  Patient reports feeling weak and feeling like her iron is low.  Contractions: Not present. Vag. Bleeding: None.  Movement: Present. Denies leaking of fluid.   The following portions of the patient's history were reviewed and updated as appropriate: allergies, current medications, past family history, past medical history, past social history, past surgical history and problem list.   Objective:   Vitals:   03/13/20 1308  BP: (!) 129/70  Pulse: 72  Weight: (!) 258 lb 6.4 oz (117.2 kg)    Fetal Status: Fetal Heart Rate (bpm): 155 Fundal Height: 41 cm Movement: Present  Presentation: Vertex  General:  Alert, oriented and cooperative. Patient is in no acute distress.  Skin: Skin is warm and dry. No rash noted.   Cardiovascular: Normal heart rate noted  Respiratory: Normal respiratory effort, no problems with respiration noted  Abdomen: Soft, gravid, appropriate for gestational age.  Pain/Pressure: Present     Pelvic: Cervical exam performed in the presence of a chaperone Dilation: Closed Effacement (%): Thick Station: Ballotable  Extremities: Normal range of motion.  Edema: None  Mental Status: Normal mood and affect. Normal behavior. Normal judgment and thought content.   Labs and Imaging: CBC Latest Ref Rng & Units 02/24/2020 01/13/2020 09/23/2019  WBC 4.0 - 10.5 K/uL 9.8 10.3 10.1  Hemoglobin 12.0 - 15.0 g/dL 10.4(L) 11.5 12.6  Hematocrit 36 - 46 % 31.4(L) 33.0(L) 37.1  Platelets  150 - 400 K/uL 216 253 285   [place] MFM OB FOLLOW UP  Result Date: 02/24/2020 ----------------------------------------------------------------------  OBSTETRICS REPORT                       (Signed Final 02/24/2020 04:12 pm) ---------------------------------------------------------------------- Patient Info  ID #:       [id number]                          D.O.B.:  07-Jan-2001 (18 yrs)  Name:       Briana Curry                 Visit Date: 02/24/2020 03:13 pm ---------------------------------------------------------------------- Performed By  Attending:        Johnell Comings MD         Ref. Address:     [place]  Performed By:     Rodrigo Ran BS      [facility]  Fetal [facility]  Referred By:      [facility] ---------------------------------------------------------------------- Orders  #  Description                           Code        Ordered By  1  [place] MFM OB FOLLOW UP                   81191.47    Sander Nephew ----------------------------------------------------------------------  #  Order #                     Accession #                Episode #  1  [id number]                   [id number]                 [id number] ---------------------------------------------------------------------- Indications  Obesity complicating pregnancy, third          O99.213  trimester (pregravid BMI 42)  Pyelectasis of fetus on prenatal ultrasound    O28.3  (left)  [redacted] weeks gestation of pregnancy                Z3A.34  Encounter for other antenatal screening        Z36.2  follow-up  Low risk NIPS, Neg Horizon, Neg MSAFP ---------------------------------------------------------------------- Vital Signs                                                 Height:        5'3"  ---------------------------------------------------------------------- Fetal Evaluation  Num Of Fetuses:         1  Fetal Heart Rate(bpm):  173  Cardiac Activity:       Observed  Presentation:           Cephalic  Placenta:               Anterior  P. Cord Insertion:      Visualized  Amniotic Fluid  AFI FV:      Within normal limits  AFI Sum(cm)     %Tile       Largest Pocket(cm)  14              49          4.2  RUQ(cm)       RLQ(cm)       LUQ(cm)        LLQ(cm)  3.2           4.2           2.6            4 ---------------------------------------------------------------------- Biometry  BPD:      90.8  mm     G. Age:  36w 6d         98  %    CI:        75.93   %  70 - 86                                                          FL/HC:      19.7   %    19.4 - 21.8  HC:      330.3  mm     G. Age:  37w 4d         92  %    HC/AC:      1.05        0.96 - 1.11  AC:      315.5  mm     G. Age:  35w 3d         87  %    FL/BPD:     71.6   %    71 - 87  FL:         65  mm     G. Age:  33w 4d         25  %    FL/AC:      20.6   %    20 - 24  HUM:        59  mm     G. Age:  34w 1d         60  %  Est. FW:    2648  gm    5 lb 13 oz      78  % ---------------------------------------------------------------------- OB History  Gravidity:    1         Term:   0        Prem:   0        SAB:   0  TOP:          0       Ectopic:  0        Living: 0 ---------------------------------------------------------------------- Gestational Age  LMP:           36w 1d        Date:  06/16/19                 EDD:   03/22/20  U/S Today:     35w 6d                                        EDD:   03/24/20  Best:          34w 1d     Det. ByLoman Chroman         EDD:   04/05/20                                      (08/10/19) ---------------------------------------------------------------------- Anatomy  Cranium:               Appears normal         LVOT:                   Previously seen  Cavum:                 Previously seen  Aortic Arch:             Previously seen  Ventricles:            Appears normal         Ductal Arch:            Previously seen  Choroid Plexus:        Previously seen        Diaphragm:              Previously seen  Cerebellum:            Previously seen        Stomach:                Appears normal, left                                                                        sided  Posterior Fossa:       Previously seen        Abdomen:                Appears normal  Nuchal Fold:           Not applicable (>06    Abdominal Wall:         Previously seen                         wks GA)  Face:                  Orbits and profile     Cord Vessels:           Previously seen                         previously seen  Lips:                  Previously seen        Kidneys:                Left UTD (9.3 mm)  Palate:                Previously seen        Bladder:                Appears normal  Thoracic:              Previously seen        Spine:                  Previously seen  Heart:                 Previously seen        Upper Extremities:      Previously seen  RVOT:                  Previously seen        Lower Extremities:      Previously seen  Other:  Feet prev visualized. 5th digits prev visualized. Technically difficult          due to maternal habitus and fetal position. Female gender. ----------------------------------------------------------------------  Comments  This patient was seen for a follow up growth scan due to  maternal obesity and left pyelectasis that was noted during  her prior ultrasound exam.  She denies any problems since  her last exam.  She was informed that the fetal growth and amniotic fluid  level appears appropriate for her gestational age.  Unilateral left pyelectasis measuring 0.9 cm dilated was  noted on today's exam.  The right fetal kidney appears within  normal limits.  The patient was advised to notify her  pediatrician after delivery regarding the left pyelectasis that  was noted during her prenatal ultrasounds.   Her pediatrician  will order additional imaging studies of the baby's kidneys  after birth if necessary.  As the fetal growth is within normal limits, no further exams  were scheduled in our office. ----------------------------------------------------------------------                   Johnell Comings, MD Electronically Signed Final Report   02/24/2020 04:12 pm ----------------------------------------------------------------------   Assessment and Plan:  Pregnancy: G1P0 at [redacted]w[redacted]d 1. Maternal morbid obesity, antepartum ([place]) 2. Significant discrepancy between uterine size and clinical dates, antepartum, third trimester TWG 16 lb, doing well.  EFW 78% on 02/24/20.  Will obtain another scan given habitus and fundal height discrepancy. - [place] MFM OB FOLLOW UP; Future  3. [redacted] weeks gestation of pregnancy 4. High risk teen pregnancy in third trimester Last hemoglobin was 10.4.  Not taking PNV, says she needs refill. PNV and Niferex refilled for patient. Advised to take both at same time as prenatal vitamin helps with iron absorption.  Pelvic cultures done today. - iron polysaccharides (NIFEREX) 150 MG capsule; Take 1 capsule (150 mg total) by mouth daily.  Dispense: 30 capsule; Refill: 3 - Prenatal Vit-Fe Fumarate-FA (PRENATAL VITAMIN) 27-0.8 MG TABS; Take 1 tablet by mouth daily.  Dispense: 30 tablet; Refill: 5 - Strep Gp B NAA - Cervicovaginal ancillary only( [place]) Labor symptoms and general obstetric precautions including but not limited to vaginal bleeding, contractions, leaking of fluid and fetal movement were reviewed in detail with the patient. Please refer to After Visit Summary for other counseling recommendations.   Return in about 1 week (around 03/20/2020) for OFFICE OB Visit.  Future Appointments  Date Time Provider [place]  03/21/2020  2:45 PM [facility] [place] NURSE [facility]  03/21/2020  3:00 PM [facility] US[facility] [place]    Verita Schneiders, MD

## 2020-03-15 ENCOUNTER — Other Ambulatory Visit: Payer: Self-pay

## 2020-03-15 ENCOUNTER — Inpatient Hospital Stay (HOSPITAL_COMMUNITY)
Admission: AD | Admit: 2020-03-15 | Discharge: 2020-03-15 | Disposition: A | Discharge status: Home / Self Care | Payer: Medicaid Other | Attending: Obstetrics and Gynecology | Admitting: Obstetrics and Gynecology

## 2020-03-15 ENCOUNTER — Encounter: Payer: Self-pay | Admitting: Obstetrics & Gynecology

## 2020-03-15 ENCOUNTER — Encounter (HOSPITAL_COMMUNITY): Payer: Self-pay | Admitting: Obstetrics and Gynecology

## 2020-03-15 DIAGNOSIS — Z87891 Personal history of nicotine dependence: Secondary | ICD-10-CM | POA: Diagnosis not present

## 2020-03-15 DIAGNOSIS — O4693 Antepartum hemorrhage, unspecified, third trimester: Secondary | ICD-10-CM | POA: Insufficient documentation

## 2020-03-15 DIAGNOSIS — J45909 Unspecified asthma, uncomplicated: Secondary | ICD-10-CM | POA: Diagnosis not present

## 2020-03-15 DIAGNOSIS — Z3A37 37 weeks gestation of pregnancy: Secondary | ICD-10-CM | POA: Diagnosis not present

## 2020-03-15 DIAGNOSIS — Z79899 Other long term (current) drug therapy: Secondary | ICD-10-CM | POA: Diagnosis not present

## 2020-03-15 DIAGNOSIS — O26899 Other specified pregnancy related conditions, unspecified trimester: Secondary | ICD-10-CM

## 2020-03-15 DIAGNOSIS — O26853 Spotting complicating pregnancy, third trimester: Secondary | ICD-10-CM | POA: Diagnosis not present

## 2020-03-15 DIAGNOSIS — Z7982 Long term (current) use of aspirin: Secondary | ICD-10-CM | POA: Insufficient documentation

## 2020-03-15 DIAGNOSIS — O26893 Other specified pregnancy related conditions, third trimester: Secondary | ICD-10-CM | POA: Diagnosis not present

## 2020-03-15 DIAGNOSIS — O99513 Diseases of the respiratory system complicating pregnancy, third trimester: Secondary | ICD-10-CM | POA: Insufficient documentation

## 2020-03-15 DIAGNOSIS — R109 Unspecified abdominal pain: Secondary | ICD-10-CM | POA: Diagnosis not present

## 2020-03-15 DIAGNOSIS — R102 Pelvic and perineal pain: Secondary | ICD-10-CM | POA: Diagnosis not present

## 2020-03-15 DIAGNOSIS — O99820 Streptococcus B carrier state complicating pregnancy: Secondary | ICD-10-CM

## 2020-03-15 LAB — URINALYSIS, ROUTINE W REFLEX MICROSCOPIC
Bilirubin Urine: NEGATIVE
Glucose, UA: NEGATIVE mg/dL
Ketones, ur: NEGATIVE mg/dL
Nitrite: NEGATIVE
Protein, ur: 30 mg/dL — AB
RBC / HPF: >50 RBC/hpf — ABNORMAL HIGH (ref 0–5)
Specific Gravity, Urine: 1.005 (ref 1.005–1.030)
pH: 8 (ref 5.0–8.0)

## 2020-03-15 LAB — CERVICOVAGINAL ANCILLARY ONLY
Bacterial Vaginitis (gardnerella): NEGATIVE
Candida Glabrata: NEGATIVE
Candida Vaginitis: POSITIVE — AB
Chlamydia: NEGATIVE
Comment: NEGATIVE
Comment: NEGATIVE
Comment: NEGATIVE
Comment: NEGATIVE
Comment: NEGATIVE
Comment: NORMAL
Neisseria Gonorrhea: NEGATIVE
Trichomonas: NEGATIVE

## 2020-03-15 LAB — STREP GP B NAA: Strep Gp B NAA: POSITIVE — AB

## 2020-03-15 MED ORDER — TERCONAZOLE 0.8 % VA CREA: 1.0000 | TOPICAL_CREAM | Freq: Every day | VAGINAL | 0 refills | Status: DC

## 2020-03-15 NOTE — Discharge Instructions (Signed)
Labor and Vaginal Delivery  Vaginal delivery means that you give birth by pushing your baby out of your birth canal (vagina). A team of health care providers will help you before, during, and after vaginal delivery. Birth experiences are unique for every woman and every pregnancy, and birth experiences vary depending on where you choose to give birth. What happens when I arrive at the birth center or hospital? Once you are in labor and have been admitted into the hospital or birth center, your health care provider may:  Review your pregnancy history and any concerns that you have.  Insert an IV into one of your veins. This may be used to give you fluids and medicines.  Check your blood pressure, pulse, temperature, and heart rate (vital signs).  Check whether your bag of water (amniotic sac) has broken (ruptured).  Talk with you about your birth plan and discuss pain control options. Monitoring Your health care provider may monitor your contractions (uterine monitoring) and your baby's heart rate (fetal monitoring). You may need to be monitored:  Often, but not continuously (intermittently).  All the time or for long periods at a time (continuously). Continuous monitoring may be needed if: ? You are taking certain medicines, such as medicine to relieve pain or make your contractions stronger. ? You have pregnancy or labor complications. Monitoring may be done by:  Placing a special stethoscope or a handheld monitoring device on your abdomen to check your baby's heartbeat and to check for contractions.  Placing monitors on your abdomen (external monitors) to record your baby's heartbeat and the frequency and length of contractions.  Placing monitors inside your uterus through your vagina (internal monitors) to record your baby's heartbeat and the frequency, length, and strength of your contractions. Depending on the type of monitor, it may remain in your uterus or on your baby's head  until birth.  Telemetry. This is a type of continuous monitoring that can be done with external or internal monitors. Instead of having to stay in bed, you are able to move around during telemetry. Physical exam Your health care provider may perform frequent physical exams. This may include:  Checking how and where your baby is positioned in your uterus.  Checking your cervix to determine: ? Whether it is thinning out (effacing). ? Whether it is opening up (dilating). What happens during labor and delivery?  Normal labor and delivery is divided into the following three stages: Stage 1  This is the longest stage of labor.  This stage can last for hours or days.  Throughout this stage, you will feel contractions. Contractions generally feel mild, infrequent, and irregular at first. They get stronger, more frequent (about every 2-3 minutes), and more regular as you move through this stage.  This stage ends when your cervix is completely dilated to 4 inches (10 cm) and completely effaced. Stage 2  This stage starts once your cervix is completely effaced and dilated and lasts until the delivery of your baby.  This stage may last from 20 minutes to 2 hours.  This is the stage where you will feel an urge to push your baby out of your vagina.  You may feel stretching and burning pain, especially when the widest part of your baby's head passes through the vaginal opening (crowning).  Once your baby is delivered, the umbilical cord will be clamped and cut. This usually occurs after waiting a period of 1-2 minutes after delivery.  Your baby will be placed on your   bare chest (skin-to-skin contact) in an upright position and covered with a warm blanket. Watch your baby for feeding cues, like rooting or sucking, and help the baby to your breast for his or her first feeding. Stage 3  This stage starts immediately after the birth of your baby and ends after you deliver the placenta.  This  stage may take anywhere from 5 to 30 minutes.  After your baby has been delivered, you will feel contractions as your body expels the placenta and your uterus contracts to control bleeding. What can I expect after labor and delivery?  After labor is over, you and your baby will be monitored closely until you are ready to go home to ensure that you are both healthy. Your health care team will teach you how to care for yourself and your baby.  You and your baby will stay in the same room (rooming in) during your hospital stay. This will encourage early bonding and successful breastfeeding.  You may continue to receive fluids and medicines through an IV.  Your uterus will be checked and massaged regularly (fundal massage).  You will have some soreness and pain in your abdomen, vagina, and the area of skin between your vaginal opening and your anus (perineum).  If an incision was made near your vagina (episiotomy) or if you had some vaginal tearing during delivery, cold compresses may be placed on your episiotomy or your tear. This helps to reduce pain and swelling.  You may be given a squirt bottle to use instead of wiping when you go to the bathroom. To use the squirt bottle, follow these steps: ? Before you urinate, fill the squirt bottle with warm water. Do not use hot water. ? After you urinate, while you are sitting on the toilet, use the squirt bottle to rinse the area around your urethra and vaginal opening. This rinses away any urine and blood. ? Fill the squirt bottle with clean water every time you use the bathroom.  It is normal to have vaginal bleeding after delivery. Wear a sanitary pad for vaginal bleeding and discharge. Summary  Vaginal delivery means that you will give birth by pushing your baby out of your birth canal (vagina).  Your health care provider may monitor your contractions (uterine monitoring) and your baby's heart rate (fetal monitoring).  Your health care  provider may perform a physical exam.  Normal labor and delivery is divided into three stages.  After labor is over, you and your baby will be monitored closely until you are ready to go home. This information is not intended to replace advice given to you by your health care provider. Make sure you discuss any questions you have with your health care provider. Document Revised: 09/09/2017 Document Reviewed: 09/09/2017 Elsevier Patient Education  2020 [employer] Inc[employer]  

## 2020-03-15 NOTE — MAU Provider Note (Signed)
Chief Complaint:  Abdominal Pain and Vaginal Bleeding   First Provider Initiated Contact with Patient 03/15/20 2254     HPI: Briana Curry is a 19 y.o. G1P0 at 43w0dwho presents to maternity admissions reporting seeing spots of brown blood when wiping.  Also has some intermittent pelvic cramping.  Worried about the blood.  Was checked 2 days ago. She reports good fetal movement, denies LOF, vaginal bleeding, vaginal itching/burning, urinary symptoms, h/a, dizziness, n/v, diarrhea, constipation or fever/chills.  She denies headache, visual changes or RUQ abdominal pain.  Abdominal Pain This is a new problem. The current episode started today. The onset quality is gradual. The problem occurs intermittently. The problem has been unchanged. The quality of the pain is cramping. The abdominal pain does not radiate. Pertinent negatives include no constipation, diarrhea, fever or frequency. Nothing aggravates the pain. The pain is relieved by nothing. She has tried nothing for the symptoms.  Vaginal Bleeding The patient's primary symptoms include pelvic pain and vaginal bleeding (spotting). The patient's pertinent negatives include no genital itching, genital lesions or genital odor. This is a new problem. The current episode started today. The problem occurs intermittently. The problem has been resolved. Associated symptoms include abdominal pain. Pertinent negatives include no constipation, diarrhea, fever or frequency. The vaginal discharge was bloody. The vaginal bleeding is spotting. She has not been passing clots. She has not been passing tissue. Nothing aggravates the symptoms. She has tried nothing for the symptoms.     RN note: Tonight when I wiped I was bleeding. States was more than spotting. Having some abd cramping. Had sve 2 days ago and was closed. Reports good FM  Past Medical History: Past Medical History:  Diagnosis Date  . Anemia   . Asthma   . Broken arm   . Iron deficiency   .  Multiple allergies     Past obstetric history: OB History  Gravida Para Term Preterm AB Living  1         0  SAB TAB Ectopic Multiple Live Births               # Outcome Date GA Lbr Len/2nd Weight Sex Delivery Anes PTL Lv  1 Current             Past Surgical History: Past Surgical History:  Procedure Laterality Date  . ARM HARDWARE REMOVAL      Family History: Family History  Problem Relation Age of Onset  . Asthma Mother   . Asthma Sister   . Eczema Sister   . Urticaria Sister   . Asthma Brother   . Eczema Brother     Social History: Social History   Tobacco Use  . Smoking status: Former Smoker    Types: Cigars    Quit date: 07/14/2019    Years since quitting: 0.6  . Smokeless tobacco: Never Used  Vaping Use  . Vaping Use: Never used  Substance Use Topics  . Alcohol use: Not Currently  . Drug use: No    Allergies:  Allergies  Allergen Reactions  . Shrimp [Shellfish Allergy] Anaphylaxis    Pt states she is also allergic to roaches   . Lactose Intolerance (Gi)   . Lactose Intolerance (Gi)   . Other Rash    [employer] sweet and sour sauce caused rash and seafood    Meds:  Medications Prior to Admission  Medication Sig Dispense Refill Last Dose  . Prenatal Vit-Fe Fumarate-FA (PRENATAL VITAMIN) 27-0.8 MG TABS Take  1 tablet by mouth daily. 30 tablet 5 03/15/2020 at Unknown time  . aspirin EC 81 MG tablet Take 1 tablet (81 mg total) by mouth daily. Take after 12 weeks for prevention of preeclampsia later in pregnancy (Patient not taking: Reported on 12/15/2019) 30 tablet 2   . iron polysaccharides (NIFEREX) 150 MG capsule Take 1 capsule (150 mg total) by mouth daily. 30 capsule 3   . terconazole (TERAZOL 3) 0.8 % vaginal cream Place 1 applicator vaginally at bedtime. Apply nightly for three nights. 20 g 0     I have reviewed patient's Past Medical Hx, Surgical Hx, Family Hx, Social Hx, medications and allergies.   ROS:  Review of Systems  Constitutional:  Negative for fever.  Gastrointestinal: Positive for abdominal pain. Negative for constipation and diarrhea.  Genitourinary: Positive for pelvic pain and vaginal bleeding. Negative for frequency.   Other systems negative  Physical Exam   Patient Vitals for the past 24 hrs:  BP Temp Pulse Resp Height Weight  03/15/20 2224 (!) 130/75 98.2 F (36.8 C) 101 18 -- --  03/15/20 2222 -- -- -- -- 5\' 3"  (1.6 m) (!) 117.9 kg   Constitutional: Well-developed, well-nourished female in no acute distress.  Cardiovascular: normal rate and rhythm Respiratory: normal effort, clear to auscultation bilaterally GI: Abd soft, non-tender, gravid appropriate for gestational age.   No rebound or guarding. MS: Extremities nontender, no edema, normal ROM Neurologic: Alert and oriented x 4.  GU: Neg CVAT.  PELVIC EXAM: Speculum exam:  Cervix pink, visually closed, without lesion, scant white creamy discharge, vaginal walls and external genitalia normal Dilation: Closed Effacement (%): 40 Station: Ballotable Exam by:: Hansel Feinstein, CNM  FHT:  Baseline 140 , moderate variability, accelerations present, no decelerations Contractions: Occasional,  Irregular     Labs: Results for orders placed or performed during the hospital encounter of 03/15/20 (from the past 24 hour(s))  Urinalysis, Routine w reflex microscopic     Status: Abnormal   Collection Time: 03/15/20 10:40 PM  Result Value Ref Range   Color, Urine YELLOW YELLOW   APPearance HAZY (A) CLEAR   Specific Gravity, Urine 1.005 1.005 - 1.030   pH 8.0 5.0 - 8.0   Glucose, UA NEGATIVE NEGATIVE mg/dL   Hgb urine dipstick LARGE (A) NEGATIVE   Bilirubin Urine NEGATIVE NEGATIVE   Ketones, ur NEGATIVE NEGATIVE mg/dL   Protein, ur 30 (A) NEGATIVE mg/dL   Nitrite NEGATIVE NEGATIVE   Leukocytes,Ua TRACE (A) NEGATIVE   RBC / HPF >50 (H) 0 - 5 RBC/hpf   WBC, UA 21-50 0 - 5 WBC/hpf   Bacteria, UA MANY (A) NONE SEEN   Squamous Epithelial / LPF 0-5 0 - 5    Mucus PRESENT     A/Positive/-- (02/04 1616)  Imaging:    MAU Course/MDM: I have ordered labs and reviewed results. UA remarkable for blood, possibly vaginal NST reviewed, reactive with occasional mild contractions.  Treatments in MAU included EFM, SSE.    Assessment: Single IUP at [redacted]w[redacted]d Spotting of old blood, likely related to recent exam Reactive FHR pattern  Plan: Discharge home Labor precautions and fetal kick counts Follow up in Office for prenatal visits   Encouraged to return here or to other Urgent Care/ED if she develops worsening of symptoms, increase in pain, fever, or other concerning symptoms.   Pt stable at time of discharge.  Hansel Feinstein CNM, MSN Certified Nurse-Midwife 03/15/2020 10:54 PM

## 2020-03-15 NOTE — MAU Note (Signed)
Tonight when I wiped I was bleeding. States was more than spotting. Having some abd cramping. Had sve 2 days ago and was closed. Reports good FM

## 2020-03-15 NOTE — Addendum Note (Signed)
Addended by: Verita Schneiders A on: 03/15/2020 04:42 PM   Modules accepted: Orders

## 2020-03-20 ENCOUNTER — Ambulatory Visit (INDEPENDENT_AMBULATORY_CARE_PROVIDER_SITE_OTHER): Payer: Medicaid Other | Admitting: Obstetrics and Gynecology

## 2020-03-20 ENCOUNTER — Other Ambulatory Visit: Payer: Self-pay

## 2020-03-20 VITALS — BP 118/79 | HR 105 | Wt 258.0 lb

## 2020-03-20 DIAGNOSIS — O0990 Supervision of high risk pregnancy, unspecified, unspecified trimester: Secondary | ICD-10-CM

## 2020-03-20 DIAGNOSIS — O0993 Supervision of high risk pregnancy, unspecified, third trimester: Secondary | ICD-10-CM

## 2020-03-20 DIAGNOSIS — Z6841 Body Mass Index (BMI) 40.0 and over, adult: Secondary | ICD-10-CM

## 2020-03-20 DIAGNOSIS — Z3A37 37 weeks gestation of pregnancy: Secondary | ICD-10-CM

## 2020-03-20 DIAGNOSIS — O99213 Obesity complicating pregnancy, third trimester: Secondary | ICD-10-CM

## 2020-03-20 DIAGNOSIS — O99820 Streptococcus B carrier state complicating pregnancy: Secondary | ICD-10-CM

## 2020-03-20 DIAGNOSIS — O99210 Obesity complicating pregnancy, unspecified trimester: Secondary | ICD-10-CM

## 2020-03-20 NOTE — Progress Notes (Signed)
Prenatal Visit Note Date: 03/20/2020 Clinic: [facility]  Subjective:  Briana Curry is a 19 y.o. G1P0 at [redacted]w[redacted]d being seen today for ongoing prenatal care.  She is currently monitored for the following issues for this high-risk pregnancy and has BMI 40.0-44.9, adult ([place]); Rash and other nonspecific skin eruption; Urticaria; Lactose intolerance; Adverse food reaction; Other allergic rhinitis; Obesity (BMI 30-39.9); Obesity in pregnancy; Supervision of high risk pregnancy, antepartum; and Group B Streptococcus carrier, +RV culture, currently pregnant on their problem list.  Patient reports no complaints.   Contractions: Irritability. Vag. Bleeding: None.  Movement: Present. Denies leaking of fluid.   The following portions of the patient's history were reviewed and updated as appropriate: allergies, current medications, past family history, past medical history, past social history, past surgical history and problem list. Problem list updated.  Objective:   Vitals:   03/20/20 1321  BP: 118/79  Pulse: (!) 105  Weight: 258 lb (117 kg)    Fetal Status: Fetal Heart Rate (bpm): 148 Fundal Height: 39 cm Movement: Present  Presentation: Vertex  General:  Alert, oriented and cooperative. Patient is in no acute distress.  Skin: Skin is warm and dry. No rash noted.   Cardiovascular: Normal heart rate noted  Respiratory: Normal respiratory effort, no problems with respiration noted  Abdomen: Soft, gravid, appropriate for gestational age. Pain/Pressure: Present     Pelvic:  Cervical exam performed Dilation: Closed Effacement (%): 50 Station: Ballotable  Extremities: Normal range of motion.  Edema: None  Mental Status: Normal mood and affect. Normal behavior. Normal judgment and thought content.   Urinalysis:      Assessment and Plan:  Pregnancy: G1P0 at [redacted]w[redacted]d  1. BMI 40.0-44.9, adult ([place])  2. Obesity in pregnancy  3. Supervision of high risk pregnancy,  antepartum Routine care. D/w pt more re: BC nv. Follow up growth u/s tomorrow  4. Group B Streptococcus carrier, +RV culture, currently pregnant tx in labor   Term labor symptoms and general obstetric precautions including but not limited to vaginal bleeding, contractions, leaking of fluid and fetal movement were reviewed in detail with the patient. Please refer to After Visit Summary for other counseling recommendations.  RTC: 8/12   Aletha Halim, MD

## 2020-03-21 ENCOUNTER — Ambulatory Visit: Payer: Medicaid Other | Admitting: *Deleted

## 2020-03-21 ENCOUNTER — Ambulatory Visit: Payer: Medicaid Other | Attending: Obstetrics & Gynecology

## 2020-03-21 ENCOUNTER — Other Ambulatory Visit: Payer: Self-pay

## 2020-03-21 DIAGNOSIS — Z3A37 37 weeks gestation of pregnancy: Secondary | ICD-10-CM | POA: Diagnosis not present

## 2020-03-21 DIAGNOSIS — O99820 Streptococcus B carrier state complicating pregnancy: Secondary | ICD-10-CM | POA: Insufficient documentation

## 2020-03-21 DIAGNOSIS — O26843 Uterine size-date discrepancy, third trimester: Secondary | ICD-10-CM | POA: Insufficient documentation

## 2020-03-21 DIAGNOSIS — O99213 Obesity complicating pregnancy, third trimester: Secondary | ICD-10-CM | POA: Diagnosis not present

## 2020-03-21 DIAGNOSIS — O99210 Obesity complicating pregnancy, unspecified trimester: Secondary | ICD-10-CM | POA: Insufficient documentation

## 2020-03-21 DIAGNOSIS — O283 Abnormal ultrasonic finding on antenatal screening of mother: Secondary | ICD-10-CM

## 2020-03-28 ENCOUNTER — Inpatient Hospital Stay (HOSPITAL_COMMUNITY)
Admission: AD | Admit: 2020-03-28 | Discharge: 2020-03-28 | Disposition: A | Discharge status: Home / Self Care | Payer: Medicaid Other | Attending: Obstetrics & Gynecology | Admitting: Obstetrics & Gynecology

## 2020-03-28 ENCOUNTER — Encounter (HOSPITAL_COMMUNITY): Payer: Self-pay | Admitting: Obstetrics & Gynecology

## 2020-03-28 ENCOUNTER — Other Ambulatory Visit: Payer: Self-pay

## 2020-03-28 DIAGNOSIS — Z3A00 Weeks of gestation of pregnancy not specified: Secondary | ICD-10-CM | POA: Diagnosis not present

## 2020-03-28 DIAGNOSIS — O479 False labor, unspecified: Secondary | ICD-10-CM | POA: Insufficient documentation

## 2020-03-28 NOTE — MAU Note (Signed)
Pt here with reports of contractions for the past 2 hours, every 5 minutes. She denies LOF or vaginal bleeding. Reports good fetal movement. Cervix was closed last week.

## 2020-03-28 NOTE — Discharge Instructions (Signed)
Braxton Hicks Contractions Contractions of the uterus can occur throughout pregnancy, but they are not always a sign that you are in labor. You may have practice contractions called Braxton Hicks contractions. These false labor contractions are sometimes confused with true labor. What are Braxton Hicks contractions? Braxton Hicks contractions are tightening movements that occur in the muscles of the uterus before labor. Unlike true labor contractions, these contractions do not result in opening (dilation) and thinning of the cervix. Toward the end of pregnancy (32-34 weeks), Braxton Hicks contractions can happen more often and may become stronger. These contractions are sometimes difficult to tell apart from true labor because they can be very uncomfortable. You should not feel embarrassed if you go to the hospital with false labor. Sometimes, the only way to tell if you are in true labor is for your health care provider to look for changes in the cervix. The health care provider will do a physical exam and may monitor your contractions. If you are not in true labor, the exam should show that your cervix is not dilating and your water has not broken. If there are no other health problems associated with your pregnancy, it is completely safe for you to be sent home with false labor. You may continue to have Braxton Hicks contractions until you go into true labor. How to tell the difference between true labor and false labor True labor  Contractions last 30-70 seconds.  Contractions become very regular.  Discomfort is usually felt in the top of the uterus, and it spreads to the lower abdomen and low back.  Contractions do not go away with walking.  Contractions usually become more intense and increase in frequency.  The cervix dilates and gets thinner. False labor  Contractions are usually shorter and not as strong as true labor contractions.  Contractions are usually irregular.  Contractions  are often felt in the front of the lower abdomen and in the groin.  Contractions may go away when you walk around or change positions while lying down.  Contractions get weaker and are shorter-lasting as time goes on.  The cervix usually does not dilate or become thin. Follow these instructions at home:   Take over-the-counter and prescription medicines only as told by your health care provider.  Keep up with your usual exercises and follow other instructions from your health care provider.  Eat and drink lightly if you think you are going into labor.  If Braxton Hicks contractions are making you uncomfortable: ? Change your position from lying down or resting to walking, or change from walking to resting. ? Sit and rest in a tub of warm water. ? Drink enough fluid to keep your urine pale yellow. Dehydration may cause these contractions. ? Do slow and deep breathing several times an hour.  Keep all follow-up prenatal visits as told by your health care provider. This is important. Contact a health care provider if:  You have a fever.  You have continuous pain in your abdomen. Get help right away if:  Your contractions become stronger, more regular, and closer together.  You have fluid leaking or gushing from your vagina.  You pass blood-tinged mucus (bloody show).  You have bleeding from your vagina.  You have low back pain that you never had before.  You feel your baby's head pushing down and causing pelvic pressure.  Your baby is not moving inside you as much as it used to. Summary  Contractions that occur before labor are   called Braxton Hicks contractions, false labor, or practice contractions. Braxton Hicks contractions are usually shorter, weaker, farther apart, and less regular than true labor contractions. True labor contractions usually become progressively stronger and regular, and they become more frequent. Fetal Movement Counts Patient Name:  ________________________________________________ Patient Due Date: ____________________ What is a fetal movement count?  A fetal movement count is the number of times that you feel your baby move during a certain amount of time. This may also be called a fetal kick count. A fetal movement count is recommended for every pregnant woman. You may be asked to start counting fetal movements as early as week 28 of your pregnancy. Pay attention to when your baby is most active. You may notice your baby's sleep and wake cycles. You may also notice things that make your baby move more. You should do a fetal movement count:  When your baby is normally most active.  At the same time each day. A good time to count movements is while you are resting, after having something to eat and drink. How do I count fetal movements? 1. Find a quiet, comfortable area. Sit, or lie down on your side. 2. Write down the date, the start time and stop time, and the number of movements that you felt between those two times. Take this information with you to your health care visits. 3. Write down your start time when you feel the first movement. 4. Count kicks, flutters, swishes, rolls, and jabs. You should feel at least 10 movements. 5. You may stop counting after you have felt 10 movements, or if you have been counting for 2 hours. Write down the stop time. 6. If you do not feel 10 movements in 2 hours, contact your health care provider for further instructions. Your health care provider may want to do additional tests to assess your baby's well-being. Contact a health care provider if:  You feel fewer than 10 movements in 2 hours.  Your baby is not moving like he or she usually does. Date: ____________ Start time: ____________ Stop time: ____________ Movements: ____________ Date: ____________ Start time: ____________ Stop time: ____________ Movements: ____________ Date: ____________ Start time: ____________ Stop time:  ____________ Movements: ____________ Date: ____________ Start time: ____________ Stop time: ____________ Movements: ____________ Date: ____________ Start time: ____________ Stop time: ____________ Movements: ____________ Date: ____________ Start time: ____________ Stop time: ____________ Movements: ____________ Date: ____________ Start time: ____________ Stop time: ____________ Movements: ____________ Date: ____________ Start time: ____________ Stop time: ____________ Movements: ____________ Date: ____________ Start time: ____________ Stop time: ____________ Movements: ____________ This information is not intended to replace advice given to you by your health care provider. Make sure you discuss any questions you have with your health care provider. Document Revised: 03/25/2019 Document Reviewed: 03/25/2019 Elsevier Patient Education  [place].  Pain Relief During Labor and Delivery Many things can cause pain during labor and delivery, including: Pressure on bones and ligaments due to the baby moving through the pelvis. Stretching of tissues due to the baby moving through the birth canal. Muscle tension due to anxiety or nervousness. The uterus tightening (contracting) and relaxing to help move the baby. There are many ways to deal with the pain of labor and delivery. They include: Taking prenatal classes. Taking these classes helps you know what to expect during your baby's birth. What you learn will increase your confidence and decrease your anxiety. Practicing relaxation techniques or doing relaxing activities, such as: Focused breathing. Meditation. Visualization. Aroma therapy. Listening  to your favorite music. Hypnosis. Taking a warm shower or bath (hydrotherapy). This may: Provide comfort and relaxation. Lessen your perception of pain. Decrease the amount of pain medicine needed. Decrease the length of labor. Getting a massage or counterpressure on your back. Applying  warm packs or ice packs. Changing positions often, moving around, or using a birthing ball. Getting: Pain medicine through an IV or injection into a muscle. Pain medicine inserted into your spinal column. Injections of sterile water just under the skin on your lower back (intradermal injections). Laughing gas (nitrous oxide). Discuss your pain control options with your health care provider during your prenatal visits. Explore the options offered by your hospital or birth center. What kinds of medicine are available? There are two kinds of medicines that can be used to relieve pain during labor and delivery: Analgesics. These medicines decrease pain without causing you to lose feeling or the ability to move your muscles. Anesthetics. These medicines block feeling in the body and can decrease your ability to move freely. Both of these kinds of medicine can cause minor side effects, such as nausea, trouble concentrating, and sleepiness. They can also decrease the baby's heart rate before birth and affect the baby's breathing rate after birth. For this reason, health care providers are careful about when and how much medicine is given. What are specific medicines and procedures that provide pain relief? Local Anesthetics Local anesthetics are used to numb a small area of the body. They may be used along with another kind of anesthetic or used to numb the nerves of the vagina, cervix, and perineum during the second stage of labor. General Anesthetics General anesthetics cause you to lose consciousness so you do not feel pain. They are usually only used for an emergency cesarean delivery. General anesthetics are given through an IV tube and a mask. Pudendal Block A pudendal block is a form of local anesthetic. It may be used to relieve the pain associated with pushing or stretching of the perineum at the time of delivery or to further numb the perineum. A pudendal block is done by injecting numbing  medicine through the vaginal wall into a nerve in the pelvis. Epidural Analgesia Epidural analgesia is given through a flexible IV catheter that is inserted into the lower back. Numbing medicine is delivered continuously to the area near your spinal column nerves (epidural space). After having this type of analgesia, you may be able to move your legs but you most likely will not be able to walk. Depending on the amount of medicine given, you may lose all feeling in the lower half of your body, or you may retain some level of sensation, including the urge to push. Epidural analgesia can be used to provide pain relief for a vaginal birth. Spinal Block A spinal block is similar to epidural analgesia, but the medicine is injected into the spinal fluid instead of the epidural space. A spinal block is only given once. It starts to relieve pain quickly, but the pain relief lasts only 1-6 hours. Spinal blocks can be used for cesarean deliveries. Combined Spinal-Epidural (CSE) Block A CSE block combines the effects of a spinal block and epidural analgesia. The spinal block works quickly to block all pain. The epidural analgesia provides continuous pain relief, even after the effects of the spinal block have worn off. This information is not intended to replace advice given to you by your health care provider. Make sure you discuss any questions you have with your  health care provider. Document Revised: 07/18/2017 Document Reviewed: 12/27/2015 Elsevier Patient Education  [place] discomfort from [facility] contractions by changing position, resting in a warm bath, drinking plenty of water, or practicing deep breathing. This information is not intended to replace advice given to you by your health care provider. Make sure you discuss any questions you have with your health care provider. Document Revised: 07/18/2017 Document Reviewed: 12/19/2016 Elsevier Patient Education  [place].

## 2020-03-28 NOTE — MAU Note (Signed)
I have communicated with Dr. Rebekah Chesterfield and reviewed vital signs:  Vitals:   03/28/20 0314 03/28/20 0445  BP: 131/83 116/60  Pulse: (!) 112 99  Resp: 20   Temp: 98.5 F (36.9 C)   SpO2: 99%     Vaginal exam:  Dilation: 1 Effacement (%): Thick Cervical Position: Posterior Station: -3 Presentation: Vertex Exam by:: Maryagnes Amos, RN,   Also reviewed contraction pattern and that non-stress test is reactive.  It has been documented that patient is contracting irregularly with no cervical change over 1.5 hours not indicating active labor.  Patient denies any other complaints.  Based on this report provider has given order for discharge.  A discharge order and diagnosis entered by a provider.   Labor discharge instructions reviewed with patient.

## 2020-03-30 ENCOUNTER — Encounter: Payer: Self-pay | Admitting: Obstetrics

## 2020-03-30 ENCOUNTER — Encounter: Payer: Medicaid Other | Admitting: Obstetrics and Gynecology

## 2020-03-30 ENCOUNTER — Ambulatory Visit (INDEPENDENT_AMBULATORY_CARE_PROVIDER_SITE_OTHER): Payer: Medicaid Other | Admitting: Obstetrics

## 2020-03-30 ENCOUNTER — Other Ambulatory Visit: Payer: Self-pay

## 2020-03-30 ENCOUNTER — Other Ambulatory Visit: Payer: Self-pay | Admitting: Family Medicine

## 2020-03-30 VITALS — BP 121/80 | HR 102 | Wt 266.8 lb

## 2020-03-30 DIAGNOSIS — O0990 Supervision of high risk pregnancy, unspecified, unspecified trimester: Secondary | ICD-10-CM

## 2020-03-30 DIAGNOSIS — O99213 Obesity complicating pregnancy, third trimester: Secondary | ICD-10-CM

## 2020-03-30 DIAGNOSIS — O3660X0 Maternal care for excessive fetal growth, unspecified trimester, not applicable or unspecified: Secondary | ICD-10-CM

## 2020-03-30 DIAGNOSIS — E669 Obesity, unspecified: Secondary | ICD-10-CM

## 2020-03-30 DIAGNOSIS — O3663X0 Maternal care for excessive fetal growth, third trimester, not applicable or unspecified: Secondary | ICD-10-CM

## 2020-03-30 DIAGNOSIS — Z3A39 39 weeks gestation of pregnancy: Secondary | ICD-10-CM

## 2020-03-30 DIAGNOSIS — O99210 Obesity complicating pregnancy, unspecified trimester: Secondary | ICD-10-CM

## 2020-03-30 NOTE — Progress Notes (Signed)
Pt presents for ROB Recent MAU visit with NST for  false labor on 03/28/20  Pt feeling better today  Needs IOL on due date per MFM

## 2020-03-30 NOTE — Progress Notes (Signed)
Subjective:  Briana Curry is a 19 y.o. G1P0 at [redacted]w[redacted]d being seen today for ongoing prenatal care.  She is currently monitored for the following issues for this low-risk pregnancy and has BMI 40.0-44.9, adult ([place]); Rash and other nonspecific skin eruption; Urticaria; Lactose intolerance; Adverse food reaction; Other allergic rhinitis; Obesity (BMI 30-39.9); Obesity in pregnancy; Supervision of high risk pregnancy, antepartum; and Group B Streptococcus carrier, +RV culture, currently pregnant on their problem list.  Patient reports no complaints.  Contractions: Irritability.  .  Movement: Present. Denies leaking of fluid.   The following portions of the patient's history were reviewed and updated as appropriate: allergies, current medications, past family history, past medical history, past social history, past surgical history and problem list. Problem list updated.  Objective:   Vitals:   03/30/20 1527  BP: 121/80  Pulse: (!) 102  Weight: 266 lb 12.8 oz (121 kg)    Fetal Status:     Movement: Present     General:  Alert, oriented and cooperative. Patient is in no acute distress.  Skin: Skin is warm and dry. No rash noted.   Cardiovascular: Normal heart rate noted  Respiratory: Normal respiratory effort, no problems with respiration noted  Abdomen: Soft, gravid, appropriate for gestational age. Pain/Pressure: Present     Pelvic:  Cervical exam deferred        Extremities: Normal range of motion.  Edema: Trace  Mental Status: Normal mood and affect. Normal behavior. Normal judgment and thought content.   Urinalysis:      Assessment and Plan:  Pregnancy: G1P0 at [redacted]w[redacted]d  1. Supervision of high risk pregnancy, antepartum  2. Obesity in pregnancy  3. Excessive fetal growth affecting management of pregnancy, antepartum, single or unspecified fetus - IOL at 40 weeks recommended by MFM for LGA   Term labor symptoms and general obstetric precautions including but not limited to  vaginal bleeding, contractions, leaking of fluid and fetal movement were reviewed in detail with the patient. Please refer to After Visit Summary for other counseling recommendations.   Return in about 4 weeks (around 04/27/2020) for postpartum visit.   Shelly Bombard, MD  03/30/20

## 2020-03-31 ENCOUNTER — Other Ambulatory Visit: Payer: Self-pay | Admitting: Obstetrics

## 2020-04-02 ENCOUNTER — Inpatient Hospital Stay (HOSPITAL_COMMUNITY)
Admission: AD | Admit: 2020-04-02 | Discharge: 2020-04-02 | Disposition: A | Discharge status: Home / Self Care | Payer: Medicaid Other | Attending: Obstetrics and Gynecology | Admitting: Obstetrics and Gynecology

## 2020-04-02 ENCOUNTER — Other Ambulatory Visit: Payer: Self-pay

## 2020-04-02 ENCOUNTER — Encounter (HOSPITAL_COMMUNITY): Payer: Self-pay | Admitting: Obstetrics and Gynecology

## 2020-04-02 DIAGNOSIS — O471 False labor at or after 37 completed weeks of gestation: Secondary | ICD-10-CM | POA: Diagnosis not present

## 2020-04-02 DIAGNOSIS — Z3A39 39 weeks gestation of pregnancy: Secondary | ICD-10-CM | POA: Diagnosis not present

## 2020-04-02 DIAGNOSIS — O479 False labor, unspecified: Secondary | ICD-10-CM

## 2020-04-02 NOTE — MAU Note (Signed)
Briana Curry is a 19 y.o. at [redacted]w[redacted]d here in MAU reporting: for abdominal pain and contractions that comes and goes, denies Vb or LOF, +FM Onset of complaint: 9pm yesterday Pain score: 7

## 2020-04-02 NOTE — MAU Note (Signed)
I have communicated with Corliss Blacker, MD and reviewed vital signs:  Vitals:   04/02/20 2004 04/02/20 2143  BP: 114/63 125/67  Pulse: (!) 106 (!) 103  Resp: 20   Temp: 98.9 F (37.2 C)   SpO2: 100%     Vaginal exam:  Dilation: Fingertip Effacement (%): Thick Cervical Position: Posterior Presentation: Undeterminable Exam by:: Therisa Doyne, RN,   Also reviewed contraction pattern and that non-stress test is reactive.  It has been documented that patient is having uterine irritability with no cervical change over 1 hour not indicating active labor.  Patient denies any other complaints.  Based on this report provider has given order for discharge.  A discharge order and diagnosis entered by a provider.   Labor discharge instructions reviewed with patient.

## 2020-04-02 NOTE — Discharge Instructions (Signed)
Braxton Hicks Contractions °Contractions of the uterus can occur throughout pregnancy, but they are not always a sign that you are in labor. You may have practice contractions called Braxton Hicks contractions. These false labor contractions are sometimes confused with true labor. °What are Braxton Hicks contractions? °Braxton Hicks contractions are tightening movements that occur in the muscles of the uterus before labor. Unlike true labor contractions, these contractions do not result in opening (dilation) and thinning of the cervix. Toward the end of pregnancy (32-34 weeks), Braxton Hicks contractions can happen more often and may become stronger. These contractions are sometimes difficult to tell apart from true labor because they can be very uncomfortable. You should not feel embarrassed if you go to the hospital with false labor. °Sometimes, the only way to tell if you are in true labor is for your health care provider to look for changes in the cervix. The health care provider will do a physical exam and may monitor your contractions. If you are not in true labor, the exam should show that your cervix is not dilating and your water has not broken. °If there are no other health problems associated with your pregnancy, it is completely safe for you to be sent home with false labor. You may continue to have Braxton Hicks contractions until you go into true labor. °How to tell the difference between true labor and false labor °True labor °· Contractions last 30-70 seconds. °· Contractions become very regular. °· Discomfort is usually felt in the top of the uterus, and it spreads to the lower abdomen and low back. °· Contractions do not go away with walking. °· Contractions usually become more intense and increase in frequency. °· The cervix dilates and gets thinner. °False labor °· Contractions are usually shorter and not as strong as true labor contractions. °· Contractions are usually irregular. °· Contractions  are often felt in the front of the lower abdomen and in the groin. °· Contractions may go away when you walk around or change positions while lying down. °· Contractions get weaker and are shorter-lasting as time goes on. °· The cervix usually does not dilate or become thin. °Follow these instructions at home: ° °· Take over-the-counter and prescription medicines only as told by your health care provider. °· Keep up with your usual exercises and follow other instructions from your health care provider. °· Eat and drink lightly if you think you are going into labor. °· If Braxton Hicks contractions are making you uncomfortable: °? Change your position from lying down or resting to walking, or change from walking to resting. °? Sit and rest in a tub of warm water. °? Drink enough fluid to keep your urine pale yellow. Dehydration may cause these contractions. °? Do slow and deep breathing several times an hour. °· Keep all follow-up prenatal visits as told by your health care provider. This is important. °Contact a health care provider if: °· You have a fever. °· You have continuous pain in your abdomen. °Get help right away if: °· Your contractions become stronger, more regular, and closer together. °· You have fluid leaking or gushing from your vagina. °· You pass blood-tinged mucus (bloody show). °· You have bleeding from your vagina. °· You have low back pain that you never had before. °· You feel your baby’s head pushing down and causing pelvic pressure. °· Your baby is not moving inside you as much as it used to. °Summary °· Contractions that occur before labor are   called Braxton Hicks contractions, false labor, or practice contractions. °· Braxton Hicks contractions are usually shorter, weaker, farther apart, and less regular than true labor contractions. True labor contractions usually become progressively stronger and regular, and they become more frequent. °· Manage discomfort from Braxton Hicks contractions  by changing position, resting in a warm bath, drinking plenty of water, or practicing deep breathing. °This information is not intended to replace advice given to you by your health care provider. Make sure you discuss any questions you have with your health care provider. °Document Revised: 07/18/2017 Document Reviewed: 12/19/2016 °Elsevier Patient Education © 2020 Elsevier Inc. ° °

## 2020-04-02 NOTE — MAU Note (Cosign Needed)
S: [name] is a 19 y.o. G1P0 at [redacted]w[redacted]d  who presents to MAU today for labor evaluation.     Cervical exam by RN:  Dilation: Fingertip Effacement (%): Thick Cervical Position: Posterior Presentation: Undeterminable Exam by:: Therisa Doyne, RN  Fetal Monitoring: Baseline: 140bpm Variability: moderate Accelerations: present Decelerations: not present Contractions: none  MDM Discussed patient with RN. NST reviewed.   A: SIUP at [redacted]w[redacted]d  False labor  P: Discharge home Labor precautions and kick counts included in AVS Patient to follow-up with OBGYN as scheduled  Patient may return to MAU as needed or when in labor   [name], MD 2/76/3943 9:39 PM

## 2020-04-04 ENCOUNTER — Other Ambulatory Visit: Payer: Self-pay | Admitting: Family Medicine

## 2020-04-04 ENCOUNTER — Encounter: Payer: Medicaid Other | Admitting: Obstetrics and Gynecology

## 2020-04-05 ENCOUNTER — Inpatient Hospital Stay (HOSPITAL_COMMUNITY): Payer: Medicaid Other | Admitting: Anesthesiology

## 2020-04-05 ENCOUNTER — Encounter (HOSPITAL_COMMUNITY): Payer: Self-pay | Admitting: Obstetrics

## 2020-04-05 ENCOUNTER — Other Ambulatory Visit: Payer: Self-pay

## 2020-04-05 ENCOUNTER — Inpatient Hospital Stay (HOSPITAL_COMMUNITY)
Admission: AD | Admit: 2020-04-05 | Discharge: 2020-04-08 | DRG: 807 | Disposition: A | Discharge status: Home / Self Care | Payer: Medicaid Other | Attending: Obstetrics and Gynecology | Admitting: Obstetrics and Gynecology

## 2020-04-05 ENCOUNTER — Inpatient Hospital Stay (HOSPITAL_COMMUNITY): Payer: Medicaid Other

## 2020-04-05 DIAGNOSIS — Z87891 Personal history of nicotine dependence: Secondary | ICD-10-CM

## 2020-04-05 DIAGNOSIS — Z6841 Body Mass Index (BMI) 40.0 and over, adult: Secondary | ICD-10-CM

## 2020-04-05 DIAGNOSIS — Z3A40 40 weeks gestation of pregnancy: Secondary | ICD-10-CM

## 2020-04-05 DIAGNOSIS — Z20822 Contact with and (suspected) exposure to covid-19: Secondary | ICD-10-CM | POA: Diagnosis present

## 2020-04-05 DIAGNOSIS — O3663X0 Maternal care for excessive fetal growth, third trimester, not applicable or unspecified: Principal | ICD-10-CM | POA: Diagnosis present

## 2020-04-05 DIAGNOSIS — O3660X0 Maternal care for excessive fetal growth, unspecified trimester, not applicable or unspecified: Secondary | ICD-10-CM | POA: Diagnosis present

## 2020-04-05 DIAGNOSIS — Z6839 Body mass index (BMI) 39.0-39.9, adult: Secondary | ICD-10-CM

## 2020-04-05 DIAGNOSIS — O99824 Streptococcus B carrier state complicating childbirth: Secondary | ICD-10-CM | POA: Diagnosis present

## 2020-04-05 DIAGNOSIS — O99214 Obesity complicating childbirth: Secondary | ICD-10-CM | POA: Diagnosis present

## 2020-04-05 DIAGNOSIS — O99820 Streptococcus B carrier state complicating pregnancy: Principal | ICD-10-CM

## 2020-04-05 LAB — CBC
HCT: 36.9 % (ref 36.0–46.0)
Hemoglobin: 11.9 g/dL — ABNORMAL LOW (ref 12.0–15.0)
MCH: 30.4 pg (ref 26.0–34.0)
MCHC: 32.2 g/dL (ref 30.0–36.0)
MCV: 94.4 fL (ref 80.0–100.0)
Platelets: 212 10*3/uL (ref 150–400)
RBC: 3.91 MIL/uL (ref 3.87–5.11)
RDW: 13.5 % (ref 11.5–15.5)
WBC: 8.6 10*3/uL (ref 4.0–10.5)
nRBC: 0 % (ref 0.0–0.2)

## 2020-04-05 LAB — TYPE AND SCREEN
ABO/RH(D): A POS
Antibody Screen: NEGATIVE

## 2020-04-05 LAB — SARS CORONAVIRUS 2 BY RT PCR (HOSPITAL ORDER, PERFORMED IN ~~LOC~~ HOSPITAL LAB): SARS Coronavirus 2: NEGATIVE

## 2020-04-05 MED ORDER — PENICILLIN G POT IN DEXTROSE 60000 UNIT/ML IV SOLN
3.0000 10*6.[IU] | INTRAVENOUS | Status: DC
  Administered 2020-04-05 – 2020-04-06 (×8): 3 10*6.[IU] via INTRAVENOUS
  Filled 2020-04-05 (×8): qty 50

## 2020-04-05 MED ORDER — OXYCODONE-ACETAMINOPHEN 5-325 MG PO TABS: 2.0000 | ORAL_TABLET | ORAL | Status: DC | PRN

## 2020-04-05 MED ORDER — MISOPROSTOL 25 MCG QUARTER TABLET: 25.0000 ug | ORAL_TABLET | ORAL | Status: DC | PRN

## 2020-04-05 MED ORDER — OXYTOCIN-SODIUM CHLORIDE 30-0.9 UT/500ML-% IV SOLN
2.5000 [IU]/h | INTRAVENOUS | Status: DC
  Administered 2020-04-07: 2.5 [IU]/h via INTRAVENOUS
  Filled 2020-04-05 (×2): qty 500

## 2020-04-05 MED ORDER — PHENYLEPHRINE 40 MCG/ML (10ML) SYRINGE FOR IV PUSH (FOR BLOOD PRESSURE SUPPORT): 80.0000 ug | PREFILLED_SYRINGE | INTRAVENOUS | Status: DC | PRN

## 2020-04-05 MED ORDER — OXYTOCIN BOLUS FROM INFUSION
333.0000 mL | Freq: Once | INTRAVENOUS | Status: DC
  Administered 2020-04-07: 333 mL via INTRAVENOUS

## 2020-04-05 MED ORDER — FENTANYL CITRATE (PF) 100 MCG/2ML IJ SOLN
100.0000 ug | INTRAMUSCULAR | Status: DC | PRN
  Administered 2020-04-05 (×4): 100 ug via INTRAVENOUS
  Filled 2020-04-05 (×7): qty 2

## 2020-04-05 MED ORDER — EPHEDRINE 5 MG/ML INJ: 10.0000 mg | INTRAVENOUS | Status: DC | PRN

## 2020-04-05 MED ORDER — LACTATED RINGERS IV SOLN: 500.0000 mL | INTRAVENOUS | Status: DC | PRN

## 2020-04-05 MED ORDER — SODIUM CHLORIDE (PF) 0.9 % IJ SOLN
INTRAMUSCULAR | Status: DC | PRN
  Administered 2020-04-05: 12 mL/h via EPIDURAL

## 2020-04-05 MED ORDER — ACETAMINOPHEN 325 MG PO TABS
650.0000 mg | ORAL_TABLET | ORAL | Status: DC | PRN
  Administered 2020-04-06: 650 mg via ORAL
  Filled 2020-04-05: qty 2

## 2020-04-05 MED ORDER — OXYCODONE-ACETAMINOPHEN 5-325 MG PO TABS: 1.0000 | ORAL_TABLET | ORAL | Status: DC | PRN

## 2020-04-05 MED ORDER — LIDOCAINE HCL (PF) 1 % IJ SOLN
INTRAMUSCULAR | Status: DC | PRN
  Administered 2020-04-05: 10 mL via EPIDURAL

## 2020-04-05 MED ORDER — PHENYLEPHRINE 40 MCG/ML (10ML) SYRINGE FOR IV PUSH (FOR BLOOD PRESSURE SUPPORT)
80.0000 ug | PREFILLED_SYRINGE | INTRAVENOUS | Status: DC | PRN
  Filled 2020-04-05: qty 10

## 2020-04-05 MED ORDER — LACTATED RINGERS IV SOLN: 500.0000 mL | Freq: Once | INTRAVENOUS | Status: DC

## 2020-04-05 MED ORDER — SODIUM CHLORIDE 0.9 % IV SOLN
5.0000 10*6.[IU] | Freq: Once | INTRAVENOUS | Status: AC
  Administered 2020-04-05: 5 10*6.[IU] via INTRAVENOUS
  Filled 2020-04-05: qty 5

## 2020-04-05 MED ORDER — TERBUTALINE SULFATE 1 MG/ML IJ SOLN: 0.2500 mg | Freq: Once | INTRAMUSCULAR | Status: DC | PRN

## 2020-04-05 MED ORDER — ONDANSETRON HCL 4 MG/2ML IJ SOLN
4.0000 mg | Freq: Four times a day (QID) | INTRAMUSCULAR | Status: DC | PRN
  Administered 2020-04-06: 4 mg via INTRAVENOUS
  Filled 2020-04-05: qty 2

## 2020-04-05 MED ORDER — DIPHENHYDRAMINE HCL 50 MG/ML IJ SOLN: 12.5000 mg | INTRAMUSCULAR | Status: DC | PRN

## 2020-04-05 MED ORDER — LIDOCAINE HCL (PF) 1 % IJ SOLN: 30.0000 mL | INTRAMUSCULAR | Status: DC | PRN

## 2020-04-05 MED ORDER — MISOPROSTOL 50MCG HALF TABLET
50.0000 ug | ORAL_TABLET | ORAL | Status: DC | PRN
  Administered 2020-04-05 (×3): 50 ug via BUCCAL
  Filled 2020-04-05 (×4): qty 1

## 2020-04-05 MED ORDER — SOD CITRATE-CITRIC ACID 500-334 MG/5ML PO SOLN: 30.0000 mL | ORAL | Status: DC | PRN

## 2020-04-05 MED ORDER — FENTANYL-BUPIVACAINE-NACL 0.5-0.125-0.9 MG/250ML-% EP SOLN
12.0000 mL/h | EPIDURAL | Status: DC | PRN
  Administered 2020-04-06: 12 mL/h via EPIDURAL
  Filled 2020-04-05 (×2): qty 250

## 2020-04-05 MED ORDER — LACTATED RINGERS IV SOLN: INTRAVENOUS | Status: DC

## 2020-04-05 NOTE — H&P (Signed)
OBSTETRIC ADMISSION HISTORY AND PHYSICAL  Briana Curry is a 19 y.o. female G1P0 with IUP at [redacted]w[redacted]d presenting for IOL 2/2 LGA in the 95%ile. She reports +FMs. No LOF, VB, blurry vision, headaches, peripheral edema, or RUQ pain. She plans on breast and bottle feeding. She requests outpatient Paragard for birth control.  She received her care at [facility]  Dating: By 5 week [place] --->  Estimated Date of Delivery: 04/05/20  Sono:   @[redacted]w[redacted]d , normal anatomy, cephalic presentation, 3557D, 95%ile, EFW 8#8 -left pyelectasis 0.6 cm  Prenatal History/Complications: LGA Obesity  Past Medical History: Past Medical History:  Diagnosis Date  . Anemia   . Asthma   . Broken arm   . Iron deficiency   . Multiple allergies     Past Surgical History: Past Surgical History:  Procedure Laterality Date  . ARM HARDWARE REMOVAL      Obstetrical History: OB History    Gravida  1   Para      Term      Preterm      AB      Living  0     SAB      TAB      Ectopic      Multiple      Live Births              Social History: Social History   Socioeconomic History  . Marital status: Single    Spouse name: Not on file  . Number of children: Not on file  . Years of education: Not on file  . Highest education level: Not on file  Occupational History  . Not on file  Tobacco Use  . Smoking status: Former Smoker    Types: Cigars    Quit date: 07/14/2019    Years since quitting: 0.7  . Smokeless tobacco: Never Used  Vaping Use  . Vaping Use: Never used  Substance and Sexual Activity  . Alcohol use: Not Currently  . Drug use: No  . Sexual activity: Yes    Birth control/protection: None  Other Topics Concern  . Not on file  Social History Narrative   ** Merged History Encounter **       Social Determinants of Health   Financial Resource Strain:   . Difficulty of Paying Living Expenses:   Food Insecurity:   . Worried About Charity fundraiser in the Last  Year:   . Arboriculturist in the Last Year:   Transportation Needs:   . Film/video editor (Medical):   Marland Kitchen Lack of Transportation (Non-Medical):   Physical Activity:   . Days of Exercise per Week:   . Minutes of Exercise per Session:   Stress:   . Feeling of Stress :   Social Connections:   . Frequency of Communication with Friends and Family:   . Frequency of Social Gatherings with Friends and Family:   . Attends Religious Services:   . Active Member of Clubs or Organizations:   . Attends Archivist Meetings:   Marland Kitchen Marital Status:     Family History: Family History  Problem Relation Age of Onset  . Asthma Mother   . Asthma Sister   . Eczema Sister   . Urticaria Sister   . Asthma Brother   . Eczema Brother     Allergies: Allergies  Allergen Reactions  . Shrimp [Shellfish Allergy] Anaphylaxis    Pt states she is also allergic to roaches   .  Lactose Intolerance (Gi)   . Lactose Intolerance (Gi)   . Other Rash    W[employer] sweet and sour sauce caused rash and seafood    Medications Prior to Admission  Medication Sig Dispense Refill Last Dose  . aspirin EC 81 MG tablet Take 1 tablet (81 mg total) by mouth daily. Take after 12 weeks for prevention of preeclampsia later in pregnancy (Patient not taking: Reported on 12/15/2019) 30 tablet 2   . iron polysaccharides (NIFEREX) 150 MG capsule Take 1 capsule (150 mg total) by mouth daily. (Patient not taking: Reported on 03/21/2020) 30 capsule 3   . Prenatal Vit-Fe Fumarate-FA (PRENATAL VITAMIN) 27-0.8 MG TABS Take 1 tablet by mouth daily. 30 tablet 5   . terconazole (TERAZOL 3) 0.8 % vaginal cream Place 1 applicator vaginally at bedtime. Apply nightly for three nights. 20 g 0      Review of Systems:  All systems reviewed and negative except as stated in HPI  PE: Blood pressure (!) 113/47, pulse 92, temperature 98.2 F (36.8 C), temperature source Oral, resp. rate 20, height 5\' 3"  (1.6 m), weight 121.2 kg, last  menstrual period 06/16/2019. General appearance: alert and cooperative Lungs: regular rate and effort Heart: regular rate  Abdomen: soft, non-tender Extremities: Homans sign is negative, no sign of DVT Presentation: cephalic EFM: 341 bpm, moderate variability, 15x15 accels, no decels Toco: CTX q 5-6 Dilation: Fingertip Effacement (%): 50 Station: -3  Prenatal labs: ABO, Rh: --/--/A POS (08/18 9379) Antibody: NEG (08/18 0853) Rubella: 6.45 (02/04 1616) RPR: Non Reactive (05/27 0853)  HBsAg: Negative (02/04 1616)  HIV: Non Reactive (05/27 0854)  GBS: Positive/-- (07/26 1405)  2 hr GTT 82/113/78  Prenatal Transfer Tool  Maternal Diabetes: No Genetic Screening: Normal Maternal Ultrasounds/Referrals: Other: LGA and fetal pyelectasis Fetal Ultrasounds or other Referrals:  Referred to Materal Fetal Medicine  Maternal Substance Abuse:  No Significant Maternal Medications:  None Significant Maternal Lab Results: Group B Strep positive  Results for orders placed or performed during the hospital encounter of 04/05/20 (from the past 24 hour(s))  Type and screen   Collection Time: 04/05/20  8:53 AM  Result Value Ref Range   ABO/RH(D) A POS    Antibody Screen NEG    Sample Expiration      04/08/2020,2359 Performed at [facility], [place] [address], [place], [place] [address]   CBC   Collection Time: 04/05/20  9:00 AM  Result Value Ref Range   WBC 8.6 4.0 - 10.5 K/uL   RBC 3.91 3.87 - 5.11 MIL/uL   Hemoglobin 11.9 (L) 12.0 - 15.0 g/dL   HCT 36.9 36 - 46 %   MCV 94.4 80.0 - 100.0 fL   MCH 30.4 26.0 - 34.0 pg   MCHC 32.2 30.0 - 36.0 g/dL   RDW 13.5 11.5 - 15.5 %   Platelets 212 150 - 400 K/uL   nRBC 0.0 0.0 - 0.2 %    Patient Active Problem List   Diagnosis Date Noted  . LGA (large for gestational age) fetus affecting management of mother 04/05/2020  . Group B Streptococcus carrier, +RV culture, currently pregnant 03/15/2020  . Obesity (BMI 30-39.9) 09/09/2019  .  Obesity in pregnancy 09/09/2019  . Supervision of high risk pregnancy, antepartum 09/09/2019  . Adverse food reaction 09/21/2018  . Other allergic rhinitis 09/21/2018  . Urticaria 11/03/2017  . Lactose intolerance 11/03/2017  . BMI 40.0-44.9, adult ([place]) 07/25/2016  . Rash and other nonspecific skin eruption 07/25/2016  Assessment: Briana Curry is a 19 y.o. G1P0 at [redacted]w[redacted]d here for IOL 2/2 LGA 95%ile  1. Labor: Start with cytotec, consider FB when able. 2. FWB: Cat I, EFW 4300 g extrapolated from [place]. 8#15 by Leopolds 3. Pain: per patient request 4. GBS: positive, PCN   Plan: Admit to L&D.  Risks and benefits of induction were reviewed, including failure of method, prolonged labor, need for further intervention, risk of cesarean.  Patient and family seem to understand these risks and wish to proceed. Options of cytotec, foley bulb, AROM, and pitocin reviewed, with use of each discussed.  Jaunita Mikels L Manveer Gomes, DO  04/05/2020, 10:20 AM

## 2020-04-05 NOTE — Anesthesia Preprocedure Evaluation (Signed)
Anesthesia Evaluation  Patient identified by MRN, date of birth, ID band Patient awake    Reviewed: Allergy & Precautions, H&P , NPO status , Patient's Chart, lab work & pertinent test results  History of Anesthesia Complications Negative for: history of anesthetic complications  Airway Mallampati: II  TM Distance: >3 FB Neck ROM: full    Dental no notable dental hx.    Pulmonary neg pulmonary ROS, former smoker,    Pulmonary exam normal        Cardiovascular negative cardio ROS Normal cardiovascular exam Rhythm:regular Rate:Normal     Neuro/Psych negative neurological ROS  negative psych ROS   GI/Hepatic negative GI ROS, Neg liver ROS,   Endo/Other  Morbid obesity  Renal/GU      Musculoskeletal   Abdominal   Peds  Hematology negative hematology ROS (+)   Anesthesia Other Findings   Reproductive/Obstetrics (+) Pregnancy                             Anesthesia Physical Anesthesia Plan  ASA: III  Anesthesia Plan: Epidural   Post-op Pain Management:    Induction:   PONV Risk Score and Plan:   Airway Management Planned:   Additional Equipment:   Intra-op Plan:   Post-operative Plan:   Informed Consent: I have reviewed the patients History and Physical, chart, labs and discussed the procedure including the risks, benefits and alternatives for the proposed anesthesia with the patient or authorized representative who has indicated his/her understanding and acceptance.       Plan Discussed with:   Anesthesia Plan Comments:         Anesthesia Quick Evaluation

## 2020-04-05 NOTE — Progress Notes (Signed)
Labor Progress Note INETTA DICKE is a 19 y.o. G1P0 at [redacted]w[redacted]d presented for IOL for LGA.  S: Increased pain with contractions.  FB still in placed.  Has been walking and out of bed.    O:  BP (!) 105/53   Pulse 97   Temp 98.6 F (37 C) (Oral)   Resp 18   Ht 5\' 3"  (1.6 m)   Wt 121.2 kg   LMP 06/16/2019 (Approximate)   BMI 47.35 kg/m  EFM: 130/moderate var/pos accels, one decel  CVE: Dilation: 1 Effacement (%): 60 Station: -3 Presentation: Vertex (verified by [place]) Exam by:: Dr. Darene Lamer  A&P: 19 y.o. G1P0 [redacted]w[redacted]d IOL for LGA.  #Labor: prolonged latent phase.  Cooks catheter still in place. Significant pain with contractions. Abdomin soft between contractions.  S/p cytotec x 2. Will give third cytotec and perform exam once catheter is outh. #Pain: IV PRN for now. Epi upon request #FWB: Cat II, overall reassuring.  One late decel with good recovery and good variability.  #GBS positive PCN   Matilde Haymaker, MD 6:13 PM

## 2020-04-05 NOTE — Progress Notes (Signed)
Briana Curry is a 19 y.o. G1P0 at [redacted]w[redacted]d admitted for IOL 2/2 LGA 95%ile  Subjective: Breathing through contractions  Objective: BP 129/75   Pulse 94   Temp 98.8 F (37.1 C) (Oral)   Resp 18   Ht 5\' 3"  (1.6 m)   Wt 121.2 kg   LMP 06/16/2019 (Approximate)   BMI 47.35 kg/m  No intake/output data recorded.  FHT:  FHR: 125 bpm, variability: moderate,  accelerations:  Present,  decelerations:  Absent UC:   Not well picked up on TOCO, per patient q2-3 minutes  SVE:   Dilation: 1 Effacement (%): 60 Station: -3 Exam by:: Dr. Darene Lamer  Labs: Lab Results  Component Value Date   WBC 8.6 04/05/2020   HGB 11.9 (L) 04/05/2020   HCT 36.9 04/05/2020   MCV 94.4 04/05/2020   PLT 212 04/05/2020    Assessment / Plan: Briana Curry is a 19 y.o. G1P0 at [redacted]w[redacted]d here for IOL 2/2 LGA 95%ile  1. Labor: S/p cytotec x1. Patient did not tolerate cervical exam, so will do 2nd cytotec at this time. Consider FB when able. 2. FWB: Cat I, EFW 4300 g extrapolated from [place]. 8#15 by Le[place]olds 3. Pain: per patient request 4. GBS: positive, PCN  Chiara Coltrin L Bentleigh Stankus DO OB Fellow, Faculty Practice 04/05/2020, 2:14 PM

## 2020-04-05 NOTE — Progress Notes (Signed)
Patient requesting attempt at [facility]  FB unsuccessful, Cook's catheter placed under speculum guidance. 60 mL in Uterine balloon, 40 mL in Vaginal balloon  FHR: 125 bpm, mod var, 15x15 accels, no decels- Cat I SVE: 1/60/-3  Merilyn Baba, DO OB Fellow, Faculty Practice 04/05/2020 4:48 PM

## 2020-04-05 NOTE — Progress Notes (Signed)
Due to LGA, shoulder precautions discussed in detail, including but not limited to: need for additional procedures, additional providers in the room, attendance of NICU staff, potential need for emergent Cesarean delivery.  Merilyn Baba, DO OB Fellow, Faculty Practice 04/05/2020 11:14 AM

## 2020-04-05 NOTE — Anesthesia Procedure Notes (Signed)
Epidural Patient location during procedure: OB Start time: 04/05/2020 10:09 PM End time: 04/05/2020 10:17 PM  Staffing Anesthesiologist: Lidia Collum, MD Performed: anesthesiologist   Preanesthetic Checklist Completed: patient identified, IV checked, risks and benefits discussed, monitors and equipment checked, pre-op evaluation and timeout performed  Epidural Patient position: sitting Prep: DuraPrep Patient monitoring: heart rate, continuous pulse ox and blood pressure Approach: midline Location: L3-L4 Injection technique: LOR air  Needle:  Needle type: Tuohy  Needle gauge: 17 G Needle length: 9 cm Needle insertion depth: 7 cm Catheter type: closed end flexible Catheter size: 19 Gauge Catheter at skin depth: 12 cm Test dose: negative  Assessment Events: blood not aspirated, injection not painful, no injection resistance, no paresthesia and negative IV test  Additional Notes Reason for block:procedure for pain

## 2020-04-05 NOTE — Progress Notes (Signed)
Patient ID: Briana Curry, female   DOB: July 04, 2001, 19 y.o.   MRN: [id number]  Foley still in place; s/p cytotec x 2; PCN x 3 doses  BP 123/62, P 72 FHR 120s, +accels, no decels, Cat 1 Irreg ctx Bedside u/s: vtx Cx deferred  IUP@term  Cx unfavorable LGA GBS pos  Will give another dose of buccal cytotec Plan for Pitocin most likely when foley comes out  [place] 04/05/2020 8:53 PM

## 2020-04-06 LAB — RPR: RPR Ser Ql: NONREACTIVE — AB

## 2020-04-06 MED ORDER — TERBUTALINE SULFATE 1 MG/ML IJ SOLN: 0.2500 mg | Freq: Once | INTRAMUSCULAR | Status: DC | PRN

## 2020-04-06 MED ORDER — FENTANYL CITRATE (PF) 100 MCG/2ML IJ SOLN
100.0000 ug | Freq: Once | INTRAMUSCULAR | Status: AC
  Administered 2020-04-06: 100 ug via EPIDURAL

## 2020-04-06 MED ORDER — OXYTOCIN-SODIUM CHLORIDE 30-0.9 UT/500ML-% IV SOLN
1.0000 m[IU]/min | INTRAVENOUS | Status: DC
  Administered 2020-04-06: 14 m[IU]/min via INTRAVENOUS
  Administered 2020-04-06: 12 m[IU]/min via INTRAVENOUS
  Administered 2020-04-06: 2 m[IU]/min via INTRAVENOUS

## 2020-04-06 MED ORDER — DIPHENHYDRAMINE HCL 50 MG/ML IJ SOLN
25.0000 mg | Freq: Once | INTRAMUSCULAR | Status: AC
  Administered 2020-04-06: 25 mg via INTRAVENOUS
  Filled 2020-04-06: qty 1

## 2020-04-06 MED ORDER — BUPIVACAINE HCL (PF) 0.25 % IJ SOLN
INTRAMUSCULAR | Status: DC | PRN
  Administered 2020-04-06: 4 mL via EPIDURAL
  Administered 2020-04-06: 8 mL via EPIDURAL

## 2020-04-06 MED ORDER — FENTANYL CITRATE (PF) 100 MCG/2ML IJ SOLN: INTRAMUSCULAR | Status: DC | PRN

## 2020-04-06 MED ORDER — ACETAMINOPHEN 500 MG PO TABS
1000.0000 mg | ORAL_TABLET | ORAL | Status: AC
  Administered 2020-04-06: 1000 mg via ORAL
  Filled 2020-04-06: qty 2

## 2020-04-06 MED ORDER — LIDOCAINE 5 % EX PTCH
1.0000 | MEDICATED_PATCH | CUTANEOUS | Status: DC
  Administered 2020-04-06: 1 via TRANSDERMAL
  Filled 2020-04-06: qty 1

## 2020-04-06 MED ORDER — FENTANYL CITRATE (PF) 100 MCG/2ML IJ SOLN
100.0000 ug | Freq: Once | INTRAMUSCULAR | Status: AC
  Administered 2020-04-06: 100 ug via INTRAVENOUS

## 2020-04-06 MED ORDER — FENTANYL CITRATE (PF) 100 MCG/2ML IJ SOLN
INTRAMUSCULAR | Status: DC | PRN
Start: 2020-04-06 — End: 2020-04-07
  Administered 2020-04-06: 100 ug via EPIDURAL
  Administered 2020-04-06: 50 ug via EPIDURAL

## 2020-04-06 NOTE — Progress Notes (Signed)
CLARABELLE OSCARSON is a 19 y.o. G1P0 at [redacted]w[redacted]d  Subjective: Patient tearful and moaning. Describes severe upper back pain and also sacral pain.   Objective: BP 120/74    Pulse (!) 103    Temp 97.8 F (36.6 C) (Oral)    Resp 20    Ht 5\' 3"  (1.6 m)    Wt 121.2 kg    LMP 06/16/2019 (Approximate)    BMI 47.35 kg/m  I/O last 3 completed shifts: In: -  Out: 3950 [Urine:3950] Total I/O In: -  Out: 425 [Urine:425]  FHT:  FHR: 125 bpm, variability: moderate,  accelerations:  Present,  decelerations:  Absent UC:   regular, every 2-3 minutes SVE:   Dilation: 8 Effacement (%):  (uniform swelling) Station: -1 Exam by:: Dr. Berniece Andreas  Labs: Lab Results  Component Value Date   WBC 8.6 04/05/2020   HGB 11.9 (L) 04/05/2020   HCT 36.9 04/05/2020   MCV 94.4 04/05/2020   PLT 212 04/05/2020    Assessment / Plan: #Labor: Pitocin initiated at 0100. SVE at this time demonstrates significant cervical swelling and moderate amount of caput. Patient endorses that she has been pushing inadvertently 2/2 pain. Will plan for recheck at 4 hour mark. IUPC in place.   #Pain: Epidural in place since yesterday at 0100. Anesthesia at bedside to evaluate and bolus given at 2100. Lidocaine patch to back. Encouraging side positions to help alleviate upper back pain.   #FWB:  Cat I tracing  #LGA: shoulder precautions at delivery    Janet Berlin 04/06/2020, 10:01 PM, CNM

## 2020-04-06 NOTE — Progress Notes (Signed)
Briana Curry is a 19 y.o. G1P0 at [redacted]w[redacted]d  Subjective: Patient endorsing back pain. Verbalizes to her mother that she can still feel her toes and is concerned that her epidural isn't working.  Objective: BP 106/82   Pulse (!) 207   Temp 98.2 F (36.8 C)   Resp 20   Ht 5\' 3"  (1.6 m)   Wt 121.2 kg   LMP 06/16/2019 (Approximate)   BMI 47.35 kg/m  I/O last 3 completed shifts: In: -  Out: 950 [Urine:950] Total I/O In: -  Out: 2200 [Urine:2200]  FHT:  FHR: 125 bpm, variability: moderate,  accelerations:  Present,  decelerations:  Absent UC:   regular, every 2-3 minutes SVE:   Dilation: 6 Effacement (%): 80 Station: -1 Exam by:: [employer]: Lab Results  Component Value Date   WBC 8.6 04/05/2020   HGB 11.9 (L) 04/05/2020   HCT 36.9 04/05/2020   MCV 94.4 04/05/2020   PLT 212 04/05/2020    Assessment / Plan: --Patient yelling in room. Audible from Provider report room --CNM at bedside. Rodena Piety, RN, Christy RN, Larene Beach, RN and Anesthesia at bedside --Patient s/p epidural bolus, sipping water through contractions, no longer vocalizing --Offered Vistaril or Phenergan PRN --Cat I tracing, s/p AROM and IUPC placement --Anticipate NSVD  Darlina Rumpf 04/06/2020, 5:24 PM, CNM

## 2020-04-06 NOTE — Progress Notes (Signed)
Labor Progress Note Briana Curry is a 19 y.o. G1P0 at [redacted]w[redacted]d presented for IOL for PD.  S: No pain since she had her epidural placed.  Much more comfortable.  She would like to be AROMed.  O:  BP (!) 126/94   Pulse (!) 128   Temp 97.8 F (36.6 C) (Oral)   Resp 20   Ht 5\' 3"  (1.6 m)   Wt 121.2 kg   LMP 06/16/2019 (Approximate)   BMI 47.35 kg/m  EFM: 130/moderate var/pos accels, no decels  CVE: Dilation: (P) 6 Effacement (%): (P) 80 Cervical Position: (P) Middle Station: (P) -1 Presentation: (P) Vertex Exam by:: Briana Curry) Briana Curry   A&P: 19 y.o. G1P0 [redacted]w[redacted]d here for IOL for PD.  #Labor: minimal progress since last check. AROM and placed IUPC.  Will continue pit for now as appropriate. #Pain: Epi in placed #FWB: Cat I #GBS positive PCN   Matilde Haymaker, MD 3:15 PM

## 2020-04-06 NOTE — Progress Notes (Signed)
Patient ID: Briana Curry, female   DOB: 07/24/2001, 19 y.o.   MRN: [id number]  Comfortable w/ epidural; foley is out now  BP 121/71, P 94 FHR 130s, +accels, no decels Irreg ctx Cx was 4/60 when foley came out  IUP@40 .1wks LGA Favorable cx  Plan to start Pitocin 4hr after 3rd cytotec dose Pt and fam agreeable with plan Anticipate vag del  [place]

## 2020-04-06 NOTE — Progress Notes (Signed)
Patient ID: Briana Curry, female   DOB: [birth year], 20 y.o.   MRN: [id number]  Comfortable w/ epidural; has been resting  BP 106/56, P 83 FHR 120s, +accels, no decels Ctx irreg with Pit at 101mu/min Cx 4/70/vtx -2  IUP@40 .1wks LGA Cx favorable GBS pos  Continue to titrate Pit to reg ctx/active labor  Myrtis Ser CNM 04/06/2020

## 2020-04-07 ENCOUNTER — Telehealth: Payer: Self-pay | Admitting: Radiology

## 2020-04-07 ENCOUNTER — Encounter (HOSPITAL_COMMUNITY): Payer: Self-pay | Admitting: Obstetrics

## 2020-04-07 DIAGNOSIS — Z3A40 40 weeks gestation of pregnancy: Secondary | ICD-10-CM

## 2020-04-07 MED ORDER — ZOLPIDEM TARTRATE 5 MG PO TABS: 5.0000 mg | ORAL_TABLET | Freq: Every evening | ORAL | Status: DC | PRN

## 2020-04-07 MED ORDER — METHYLERGONOVINE MALEATE 0.2 MG PO TABS: 0.2000 mg | ORAL_TABLET | ORAL | Status: DC | PRN

## 2020-04-07 MED ORDER — FERROUS SULFATE 325 (65 FE) MG PO TABS
325.0000 mg | ORAL_TABLET | Freq: Two times a day (BID) | ORAL | Status: DC
  Administered 2020-04-07 – 2020-04-08 (×4): 325 mg via ORAL
  Filled 2020-04-07 (×4): qty 1

## 2020-04-07 MED ORDER — BISACODYL 10 MG RE SUPP: 10.0000 mg | Freq: Every day | RECTAL | Status: DC | PRN

## 2020-04-07 MED ORDER — FLEET ENEMA 7-19 GM/118ML RE ENEM: 1.0000 | ENEMA | Freq: Every day | RECTAL | Status: DC | PRN

## 2020-04-07 MED ORDER — SIMETHICONE 80 MG PO CHEW: 80.0000 mg | CHEWABLE_TABLET | ORAL | Status: DC | PRN

## 2020-04-07 MED ORDER — PRENATAL MULTIVITAMIN CH
1.0000 | ORAL_TABLET | Freq: Every day | ORAL | Status: DC
  Administered 2020-04-07 – 2020-04-08 (×2): 1 via ORAL
  Filled 2020-04-07 (×2): qty 1

## 2020-04-07 MED ORDER — DIPHENHYDRAMINE HCL 25 MG PO CAPS: 25.0000 mg | ORAL_CAPSULE | Freq: Four times a day (QID) | ORAL | Status: DC | PRN

## 2020-04-07 MED ORDER — ONDANSETRON HCL 4 MG PO TABS: 4.0000 mg | ORAL_TABLET | ORAL | Status: DC | PRN

## 2020-04-07 MED ORDER — ACETAMINOPHEN 325 MG PO TABS
650.0000 mg | ORAL_TABLET | ORAL | Status: DC | PRN
  Administered 2020-04-08: 650 mg via ORAL
  Filled 2020-04-07: qty 2

## 2020-04-07 MED ORDER — METHYLERGONOVINE MALEATE 0.2 MG/ML IJ SOLN: 0.2000 mg | INTRAMUSCULAR | Status: DC | PRN

## 2020-04-07 MED ORDER — DOCUSATE SODIUM 100 MG PO CAPS
100.0000 mg | ORAL_CAPSULE | Freq: Two times a day (BID) | ORAL | Status: DC
  Administered 2020-04-08 (×2): 100 mg via ORAL
  Filled 2020-04-07 (×2): qty 1

## 2020-04-07 MED ORDER — COCONUT OIL OIL: 1.0000 "application " | TOPICAL_OIL | Status: DC | PRN

## 2020-04-07 MED ORDER — WITCH HAZEL-GLYCERIN EX PADS: 1.0000 "application " | MEDICATED_PAD | CUTANEOUS | Status: DC | PRN

## 2020-04-07 MED ORDER — MEASLES, MUMPS & RUBELLA VAC IJ SOLR: 0.5000 mL | Freq: Once | INTRAMUSCULAR | Status: DC

## 2020-04-07 MED ORDER — BENZOCAINE-MENTHOL 20-0.5 % EX AERO: 1.0000 "application " | INHALATION_SPRAY | CUTANEOUS | Status: DC | PRN

## 2020-04-07 MED ORDER — TETANUS-DIPHTH-ACELL PERTUSSIS 5-2.5-18.5 LF-MCG/0.5 IM SUSP: 0.5000 mL | Freq: Once | INTRAMUSCULAR | Status: DC

## 2020-04-07 MED ORDER — ONDANSETRON HCL 4 MG/2ML IJ SOLN: 4.0000 mg | INTRAMUSCULAR | Status: DC | PRN

## 2020-04-07 MED ORDER — DIBUCAINE (PERIANAL) 1 % EX OINT: 1.0000 "application " | TOPICAL_OINTMENT | CUTANEOUS | Status: DC | PRN

## 2020-04-07 MED ORDER — IBUPROFEN 600 MG PO TABS
600.0000 mg | ORAL_TABLET | Freq: Four times a day (QID) | ORAL | Status: DC
  Administered 2020-04-07 – 2020-04-08 (×6): 600 mg via ORAL
  Filled 2020-04-07 (×7): qty 1

## 2020-04-07 NOTE — Anesthesia Postprocedure Evaluation (Signed)
Anesthesia Post Note  Patient: Briana Curry  Procedure(s) Performed: AN AD [place]     Patient location during evaluation: Mother Baby Anesthesia Type: Epidural Level of consciousness: awake and alert and oriented Pain management: satisfactory to patient Vital Signs Assessment: post-procedure vital signs reviewed and stable Respiratory status: respiratory function stable Cardiovascular status: stable Postop Assessment: no headache, no backache, epidural receding, patient able to bend at knees, no signs of nausea or vomiting, adequate PO intake and able to ambulate Anesthetic complications: no   No complications documented.  Last Vitals:  Vitals:   04/07/20 0430 04/07/20 0530  BP: 122/79 119/70  Pulse: (!) 106 94  Resp: 18   Temp: 36.6 C 36.7 C  SpO2:  99%    Last Pain:  Vitals:   04/07/20 0530  TempSrc: Oral  PainSc: 0-No pain   Pain Goal:                   Orissa Arreaga

## 2020-04-07 NOTE — Lactation Note (Signed)
This note was copied from a baby's chart. Lactation Consultation Note  Patient Name: Briana Curry [id number] Date: 04/07/2020 Reason for consult: Initial assessment;1st time breastfeeding;Term P1, 16 hour term female infant. Infant had 2 stools and 3 voids since birth. Mom is active on the [facility] program in [facility] but she did not attend any BF classes. Tools given: hand pump, mom will pre-pump breast prior to latching infant due to have flat nipples. Mom was taught hand expression and LC notice mom was leaking colostrum from where she had nipple  piercing's LC assured mom it was normal. Per mom, herr feeding choice at admission and formula but she decided to breast and formula feed infant.  This will be mom's first time latching infant at the breast. LC entered room, per mom infant was given formula at 5:40 pm infant was in basinet and [place] notice he was cuing.  Mom was willing to try to latch infant at the breast with assistance from [facility]. LC changed a void and stool diaper while in room.  LC had mom to do breast stimulation and pre-pump with hand pump using a 27 mm breast flange, Mom latched infant on her right breast using the football hold position, infant formed a sealed and latched without difficulty infant breastfeed for 6 minutes appeared calm. Due to infant being given formula less than 2 hours prior to mom latching infant at breast, infant appeared satiety and calm. Mom will breastfeed infant according to cues, 8 to 12+ times within 24 hours. Mom will latch infant at breast first before offering formula to help establish her milk supply, mom given breast supplemental sheet. Mom will continue to do as much STS as possible. Reviewed Baby & Me book's Breastfeeding Basics.  Mom made aware of O/P services, breastfeeding support groups, community resources, and our phone # for post-discharge questions.  Maternal Data Formula Feeding for Exclusion: Yes Reason for exclusion: Mother's  choice to formula feed on admision Has patient been taught Hand Expression?: Yes Does the patient have breastfeeding experience prior to this delivery?: No  Feeding Feeding Type: Breast Fed Nipple Type: Slow - flow  LATCH Score Latch: Grasps breast easily, tongue down, lips flanged, rhythmical sucking.  Audible Swallowing: Spontaneous and intermittent  Type of Nipple: Flat  Comfort (Breast/Nipple): Soft / non-tender  Hold (Positioning): Assistance needed to correctly position infant at breast and maintain latch.  LATCH Score: 8  Interventions Interventions: Breast feeding basics reviewed;Breast compression;Assisted with latch;Skin to skin;Support pillows;Breast massage;Hand express;Position options;Pre-pump if needed;Expressed milk;Hand pump  Lactation Tools Discussed/Used Tools: Pump;Flanges Flange Size: 27 Breast pump type: Manual WIC Program: Yes Pump Review: Setup, frequency, and cleaning;Milk Storage Initiated by:: Vicente Serene, IBCLC Date initiated:: 04/07/20   Consult Status Consult Status: Follow-up Date: 04/08/20 Follow-up type: In-patient    Vicente Serene 04/07/2020, 7:33 PM

## 2020-04-07 NOTE — Telephone Encounter (Signed)
Called and left postpartum appointment information on voicemail and sent mychart message

## 2020-04-07 NOTE — Discharge Summary (Addendum)
Postpartum Discharge Summary    Patient Name: Briana Curry DOB: 12-01-2000 MRN: [id number]  Date of admission: 04/05/2020 Delivery date:04/07/2020  Delivering provider: Christin Fudge  Date of discharge: 04/08/2020  Admitting diagnosis: LGA (large for gestational age) fetus affecting management of mother [O36.60X0] Intrauterine pregnancy: [redacted]w[redacted]d    Secondary diagnosis:  Active Problems:   BMI 40.0-44.9, [place]   Group B Streptococcus carrier, +RV culture, currently pregnant   LGA (large for gestational age) fetus affecting management of mother   Additional problems: none    Discharge diagnosis: Term Pregnancy Delivered                                              Post partum procedures: None Augmentation: AROM, Cytotec and IP Foley Complications: None  Hospital course: Induction of Labor With Vaginal Delivery   19y.o. yo G1P0 at 412w2das admitted to the hospital 04/05/2020 for induction of labor.  Indication for induction: Elective for suspected LGA  Patient had an uncomplicated labor course as follows: Membrane Rupture Time/Date: 3:10 PM ,04/06/2020   Delivery Method:Vaginal, Spontaneous  Episiotomy: None  Lacerations:  2nd degree;Perineal  Details of delivery can be found in separate delivery note.  Patient had a routine postpartum course. Patient is discharged home 04/08/20.  Newborn Data: Birth date:04/07/2020  Birth time:1:59 AM  Gender:Female  Living status:Living  Apgars:9 ,9  Weight:3975 g   Magnesium Sulfate received: No BMZ received: No Rhophylac:N/A MMR:N/A T-DaP:Given prenatally Flu: N/A Transfusion:No  Physical exam  Vitals:   04/07/20 1700 04/07/20 2122 04/08/20 0614 04/08/20 1433  BP: 121/62 105/60 117/68 128/73  Pulse: 95 88 90 82  Resp: 18 19 18 16   Temp: 98.6 F (37 C) 98.3 F (36.8 C) 98.7 F (37.1 C)   TempSrc: Oral Oral Oral   SpO2: 100% 99% 100% 100%  Weight:      Height:       General: alert, cooperative and no  distress Lochia: appropriate Uterine Fundus: firm Incision: N/A DVT Evaluation: No calf swelling or tenderness  no edema Labs: Lab Results  Component Value Date   WBC 8.6 04/05/2020   HGB 11.9 (L) 04/05/2020   HCT 36.9 04/05/2020   MCV 94.4 04/05/2020   PLT 212 04/05/2020   CMP Latest Ref Rng & Units 02/24/2020  Glucose 70 - 99 mg/dL 101(H)  BUN 6 - 20 mg/dL 6  Creatinine 0.44 - 1.00 mg/dL 0.62  Sodium 135 - 145 mmol/L 137  Potassium 3.5 - 5.1 mmol/L 4.0  Chloride 98 - 111 mmol/L 105  CO2 22 - 32 mmol/L 23  Calcium 8.9 - 10.3 mg/dL 9.1  Total Protein 6.5 - 8.1 g/dL 6.6  Total Bilirubin 0.3 - 1.2 mg/dL 0.5  Alkaline Phos 38 - 126 U/L 155(H)  AST 15 - 41 U/L 19  ALT 0 - 44 U/L 15   Edinburgh Score: Edinburgh Postnatal Depression Scale Screening Tool 04/08/2020  I have been able to laugh and see the funny side of things. 1  I have looked forward with enjoyment to things. 1  I have blamed myself unnecessarily when things went wrong. 1  I have been anxious or worried for no good reason. 1  I have felt scared or panicky for no good reason. 2  Things have been getting on top of me. 1  I have been  so unhappy that I have had difficulty sleeping. 1  I have felt sad or miserable. 3  I have been so unhappy that I have been crying. 1  The thought of harming myself has occurred to me. 0  Edinburgh Postnatal Depression Scale Total 12     After visit meds:  Allergies as of 04/08/2020       Reactions   Shrimp [shellfish Allergy] Anaphylaxis   Pt states she is also allergic to roaches    Lactose Intolerance (gi) Diarrhea   Other Rash   Wendy's sweet and sour sauce caused rash and seafood        Medication List     STOP taking these medications    aspirin EC 81 MG tablet   terconazole 0.8 % vaginal cream Commonly known as: TERAZOL 3       TAKE these medications    acetaminophen 325 MG tablet Commonly known as: Tylenol Take 2 tablets (650 mg total) by mouth every 4  (four) hours as needed for mild pain (for pain scale < 4).   coconut oil Oil Apply 1 application topically as needed.   docusate sodium 100 MG capsule Commonly known as: COLACE Take 1 capsule (100 mg total) by mouth 2 (two) times daily.   ibuprofen 600 MG tablet Commonly known as: ADVIL Take 1 tablet (600 mg total) by mouth every 6 (six) hours.   iron polysaccharides 150 MG capsule Commonly known as: NIFEREX Take 1 capsule (150 mg total) by mouth daily.   Prenatal Vitamin 27-0.8 MG Tabs Take 1 tablet by mouth daily.         Discharge home in stable condition Infant Feeding: Bottle Infant Disposition:home with mother Discharge instruction: per After Visit Summary and Postpartum booklet. Activity: Advance as tolerated. Pelvic rest for 6 weeks.  Diet: routine diet Future Appointments: Future Appointments  Date Time Provider [place]  05/04/2020 10:30 AM Aletha Halim, MD [facility]   Follow up Visit:    Please schedule this patient for a Virtual postpartum visit in 4 weeks with the following provider: Any provider. Additional Postpartum F/U: n/a Low risk pregnancy Delivery mode:  Vaginal, Spontaneous  Anticipated Birth Control:  Nexplanon as outpatient.   04/08/2020 Sharion Settler, DO  Attestation of Supervision of Student:  I confirm that I have verified the information documented in the  resident's note and that I have also personally reperformed the history, physical exam and all medical decision making activities.  I have verified that all services and findings are accurately documented in this student's note; and I agree with management and plan as outlined in the documentation. I have also made any necessary editorial changes.  Randa Ngo, [place] for [facility], [place] [facility] 04/08/2020 8:59 PM

## 2020-04-07 NOTE — Progress Notes (Signed)
Labor Progress Note LIZVET CHUNN is a 19 y.o. G1P0 at [redacted]w[redacted]d presented for IOL due to LGA fetus S: Pt has been pushing since 0012.  She feels constant rectal pressure.  There is significant caput noted and fetus still has high station  O:  BP (!) 145/96    Pulse (!) 108    Temp 97.8 F (36.6 C) (Oral)    Resp 20    Ht 5\' 3"  (1.6 m)    Wt 121.2 kg    LMP 06/16/2019 (Approximate)    BMI 47.35 kg/m  EFM: 110/moderate variability/no decels  CVE: Dilation: 10 Effacement (%):  (uniform swelling) Cervical Position: Middle Station: -2 Presentation: Vertex Exam by:: Dr. Elgie Congo   A&P: 19 y.o. G1P0 [redacted]w[redacted]d  #Labor: pushing#Pain:  #FWB:  Will reassess at approx 0200 for fetal descent.  Caput is at +1 station; however, bony skull is at -1 or -2 If no significant descent , consider primary LTCS Griffin Basil, MD 1:26 AM

## 2020-04-08 MED ORDER — COCONUT OIL OIL: 1.0000 "application " | TOPICAL_OIL | 0 refills | Status: DC | PRN

## 2020-04-08 MED ORDER — DOCUSATE SODIUM 100 MG PO CAPS: 100.0000 mg | ORAL_CAPSULE | Freq: Two times a day (BID) | ORAL | 0 refills | Status: DC

## 2020-04-08 MED ORDER — IBUPROFEN 600 MG PO TABS
600.0000 mg | ORAL_TABLET | Freq: Four times a day (QID) | ORAL | 0 refills | Status: DC
Start: 2020-04-08 — End: 2020-06-14

## 2020-04-08 MED ORDER — ACETAMINOPHEN 325 MG PO TABS
650.0000 mg | ORAL_TABLET | ORAL | 0 refills | Status: DC | PRN
Start: 2020-04-08 — End: 2020-06-14

## 2020-04-08 NOTE — Lactation Note (Signed)
This note was copied from a baby's chart. Lactation Consultation Note  Patient Name: Briana Curry KGYJE'H Date: 04/08/2020 Reason for consult: Follow-up assessment;1st time breastfeeding;Term  Visited with mom of a 21 hours old FT female, she's a P1 and was on the fence about doing BF and formula. Mom saw [facility] yesterday and worked on BF but she has decided that she's just going to stick to formula only. Reviewed engorgement prevention/treatment with mom since she'll be doing only bottles. [place] services no longer needed, mom and baby going home today.  Maternal Data    Feeding Feeding Type: Bottle Fed - Formula Nipple Type: Slow - flow  LATCH Score                   Interventions Interventions: Breast feeding basics reviewed  Lactation Tools Discussed/Used     Consult Status Consult Status: Complete Date: 04/08/20 Follow-up type: Call as needed    [place] 04/08/2020, 4:30 PM

## 2020-04-08 NOTE — Progress Notes (Deleted)
CSW received consult due to score 12 on Edinburgh Depression Screen.    CSW called in to complete the assessment via telephone.  MOB was receptive to CSW completing assessment over the phone and was easy to engage.  CSW reviewed MOB's Edinburgh score and MOB stated, "I remember taking the assessment but I don't remember my score or what I marked.  CSW shared MOB's responses with her and MOB stated, "I don't feel like that now." CSW provided education regarding Baby Blues vs PMADs and provided MOB with resources for mental health follow up.  CSW encouraged MOB to evaluate her mental health throughout the postpartum period and to notify a medical professional if symptoms arise; MOB agreed. CSW assessed for safety and MOB denied SI HI, and MOB denied currently having mental health symptoms. MOB reported feeling comfortable seeking help if help is needed.   CSW also provided SIDS education.  MOB asked appropriate questions and responded to CSW questions appropriately. MOB reported having a bassinet and good understanding the consequences for co-sleeping.   Laurey Arrow, MSW, LCSW Clinical Social Work [phone]

## 2020-04-08 NOTE — Progress Notes (Signed)
POSTPARTUM PROGRESS NOTE  Subjective: Briana Curry is a 19 y.o. G1P1001 s/p SVD at [redacted]w[redacted]d.  She reports she doing well. No acute events overnight. She denies any problems with ambulating, voiding or po intake. Denies nausea or vomiting. She has  passed flatus. Pain is well controlled.  Lochia is light. Patient was cosleeping with newborn at time of exam, and we discussed potential risks of cosleeping - infant placed back in basinet.   Objective: Blood pressure 105/60, pulse 88, temperature 98.3 F (36.8 C), temperature source Oral, resp. rate 19, height 5\' 3"  (1.6 m), weight 121.2 kg, last menstrual period 06/16/2019, SpO2 99 %, unknown if currently breastfeeding.  Physical Exam:  General: alert, cooperative and no distress Chest: no respiratory distress Abdomen: soft, non-tender  Uterine Fundus: firm and at level of umbilicus Extremities: No calf swelling or tenderness  no edema  Recent Labs    04/05/20 0900  HGB 11.9*  HCT 36.9    Assessment/Plan: Briana Curry is a 19 y.o. G1P1001 s/p SVD at [redacted]w[redacted]d.   Routine Postpartum Care: Doing well, pain well-controlled.  -- Continue routine care, lactation support  -- Social work consult re co-sleeping   -- Contraception: nexplanon as outpatient -- Feeding: bottle  -- Female infant, desires circ. Verbally consented at bedside.    Dispo: Plan for discharge PPD#2.  Janet Berlin, MD OB Fellow, Faculty Practice 04/08/2020 5:21 AM

## 2020-04-08 NOTE — Progress Notes (Signed)
Patient states she does not want to use lactation services.   Lewanda Rife, RN

## 2020-04-08 NOTE — Progress Notes (Signed)
CSW received consult due to score 12 on Edinburgh Depression Screen.    CSW called in to complete the assessment via telephone.  MOB was receptive to CSW completing assessment over the phone and was easy to engage.  CSW reviewed MOB's Edinburgh score and MOB stated, "I remember taking the assessment but I don't remember my score or what I marked.  CSW shared MOB's responses with her and MOB stated, "I don't feel like that now." CSW provided education regarding Baby Blues vs PMADs and provided MOB with resources for mental health follow up.  CSW encouraged MOB to evaluate her mental health throughout the postpartum period and to notify a medical professional if symptoms arise; MOB agreed. CSW assessed for safety and MOB denied SI HI, and MOB denied currently having mental health symptoms. MOB reported feeling comfortable seeking help if help is needed.   CSW also provided SIDS education.  MOB asked appropriate questions and responded to CSW questions appropriately. MOB reported having a bassinet and good understanding the consequences for co-sleeping.   Laurey Arrow, MSW, LCSW Clinical Social Work [phone]

## 2020-04-08 NOTE — Progress Notes (Signed)
RN spoke to CSW, Pri[place] over the telephone. Cleared patient to go home without barriers.

## 2020-04-11 ENCOUNTER — Telehealth: Payer: Self-pay | Admitting: Radiology

## 2020-04-11 ENCOUNTER — Telehealth: Payer: Self-pay | Admitting: *Deleted

## 2020-04-11 NOTE — Telephone Encounter (Signed)
Called pt this AM to follow up on her after hours nurse line message regarding her bleeding. She was advised to go to ED and did not. PT states she is still having some heavier bleeding and some clots, just not as big as last night. Informed pt that if she is bleeding through a pad an hour with clots, she does need to go to the [facility] to be evaluated. Pt verbalizes and understands.

## 2020-04-11 NOTE — Telephone Encounter (Signed)
Called patient per request of RN, patient complaining of passing clots and filling pad within an hour, patient instructed to follow up at [facility]. Patient was not able to go to [facility] or come to [facility] due to transportation. Patient explained that the bleeding has slowed down considerably, patient states that she will be able to have to [facility] tomorrow 04/12/20. Scheduled patient appointment for 04/12/20 @ 3:15 with Dr Ilda Basset to assess this issue.

## 2020-04-12 ENCOUNTER — Ambulatory Visit: Payer: Medicaid Other | Admitting: Obstetrics and Gynecology

## 2020-05-04 ENCOUNTER — Ambulatory Visit: Payer: Medicaid Other | Admitting: Obstetrics and Gynecology

## 2020-05-04 ENCOUNTER — Encounter: Payer: Self-pay | Admitting: Obstetrics and Gynecology

## 2020-05-04 NOTE — Progress Notes (Signed)
Patient did not keep her postpartum appointment for 05/04/2020.  Durene Romans MD Attending Center for [employer] Fish farm manager)

## 2020-06-13 ENCOUNTER — Other Ambulatory Visit (HOSPITAL_COMMUNITY)
Admission: RE | Admit: 2020-06-13 | Discharge: 2020-06-13 | Disposition: A | Discharge status: Home / Self Care | Payer: Medicaid Other | Source: Ambulatory Visit | Attending: Obstetrics and Gynecology | Admitting: Obstetrics and Gynecology

## 2020-06-13 ENCOUNTER — Ambulatory Visit: Payer: Medicaid Other

## 2020-06-13 ENCOUNTER — Other Ambulatory Visit: Payer: Self-pay

## 2020-06-13 VITALS — BP 103/72 | HR 81 | Wt 231.4 lb

## 2020-06-13 DIAGNOSIS — Z202 Contact with and (suspected) exposure to infections with a predominantly sexual mode of transmission: Secondary | ICD-10-CM | POA: Diagnosis present

## 2020-06-13 NOTE — Progress Notes (Unsigned)
Pt delivered baby on 04/07/20, SVD, epidural. Pt is breastfeeding. Pt is not interested in contraception at this time, she is currently sexually active. Pt requests STD testing today.

## 2020-06-14 ENCOUNTER — Ambulatory Visit: Payer: Medicaid Other | Admitting: Obstetrics and Gynecology

## 2020-06-14 ENCOUNTER — Other Ambulatory Visit: Payer: Self-pay | Admitting: Obstetrics & Gynecology

## 2020-06-14 DIAGNOSIS — B373 Candidiasis of vulva and vagina: Secondary | ICD-10-CM

## 2020-06-14 DIAGNOSIS — B3731 Acute candidiasis of vulva and vagina: Secondary | ICD-10-CM

## 2020-06-14 DIAGNOSIS — B9689 Other specified bacterial agents as the cause of diseases classified elsewhere: Secondary | ICD-10-CM

## 2020-06-14 LAB — CERVICOVAGINAL ANCILLARY ONLY
Bacterial Vaginitis (gardnerella): POSITIVE — AB
Candida Glabrata: NEGATIVE
Candida Vaginitis: POSITIVE — AB
Chlamydia: NEGATIVE
Comment: NEGATIVE
Comment: NEGATIVE
Comment: NEGATIVE
Comment: NEGATIVE
Comment: NEGATIVE
Comment: NORMAL
Neisseria Gonorrhea: NEGATIVE
Trichomonas: NEGATIVE

## 2020-06-14 LAB — HEPATITIS C ANTIBODY: Hep C Virus Ab: <0.1 s/co ratio (ref 0.0–0.9)

## 2020-06-14 LAB — HEPATITIS B SURFACE ANTIGEN: Hepatitis B Surface Ag: NEGATIVE

## 2020-06-14 LAB — HIV ANTIBODY (ROUTINE TESTING W REFLEX): HIV Screen 4th Generation wRfx: NONREACTIVE

## 2020-06-14 LAB — RPR: RPR Ser Ql: NONREACTIVE

## 2020-06-14 MED ORDER — METRONIDAZOLE 500 MG PO TABS: 500.0000 mg | ORAL_TABLET | Freq: Two times a day (BID) | ORAL | 0 refills | Status: AC

## 2020-06-14 MED ORDER — FLUCONAZOLE 150 MG PO TABS: 150.0000 mg | ORAL_TABLET | Freq: Once | ORAL | 3 refills | Status: AC

## 2020-06-14 NOTE — Progress Notes (Signed)
Patient was assessed and managed by nursing staff during this encounter. I have reviewed the chart and agree with the documentation and plan. I have also made any necessary editorial changes.  Bellagrace Sylvan, MD 06/14/2020 3:29 PM 

## 2020-06-21 ENCOUNTER — Other Ambulatory Visit: Payer: Self-pay | Admitting: *Deleted

## 2020-06-21 MED ORDER — METRONIDAZOLE 0.75 % VA GEL: 1.0000 | Freq: Every day | VAGINAL | 1 refills | Status: DC

## 2020-08-08 ENCOUNTER — Ambulatory Visit: Payer: Medicaid Other | Admitting: Obstetrics

## 2020-08-28 ENCOUNTER — Ambulatory Visit: Payer: Medicaid Other | Admitting: Obstetrics

## 2020-08-29 ENCOUNTER — Other Ambulatory Visit: Payer: Self-pay

## 2020-08-29 ENCOUNTER — Encounter: Payer: Self-pay | Admitting: Obstetrics

## 2020-08-29 ENCOUNTER — Ambulatory Visit (INDEPENDENT_AMBULATORY_CARE_PROVIDER_SITE_OTHER): Payer: Medicaid Other | Admitting: Obstetrics

## 2020-08-29 ENCOUNTER — Other Ambulatory Visit (HOSPITAL_COMMUNITY)
Admission: RE | Admit: 2020-08-29 | Discharge: 2020-08-29 | Disposition: A | Discharge status: Home / Self Care | Payer: Medicaid Other | Source: Ambulatory Visit | Attending: Obstetrics | Admitting: Obstetrics

## 2020-08-29 VITALS — BP 106/70 | HR 62 | Ht 63.0 in | Wt 221.2 lb

## 2020-08-29 DIAGNOSIS — Z01419 Encounter for gynecological examination (general) (routine) without abnormal findings: Secondary | ICD-10-CM

## 2020-08-29 DIAGNOSIS — Z3009 Encounter for other general counseling and advice on contraception: Secondary | ICD-10-CM | POA: Diagnosis not present

## 2020-08-29 DIAGNOSIS — N898 Other specified noninflammatory disorders of vagina: Secondary | ICD-10-CM

## 2020-08-29 NOTE — Progress Notes (Signed)
Patient presents for AEX and vaginal discharge. She states that her discharge started after her cycle went off. Denies having any odor or irritation. Patient is requesting to have her glucose checked.  Patient did no show for her postpartum visit. She delivered 04/07/20

## 2020-08-29 NOTE — Progress Notes (Signed)
Subjective:        QUINTASHA GREN is a 20 y.o. female here for a routine exam.  Current complaints: Vaginal discharge.    Personal health questionnaire:  Is patient Ashkenazi Jewish, have a family history of breast and/or ovarian cancer: no Is there a family history of uterine cancer diagnosed at age < 25, gastrointestinal cancer, urinary tract cancer, family member who is a Field seismologist syndrome-associated carrier: no Is the patient overweight and hypertensive, family history of diabetes, personal history of gestational diabetes, preeclampsia or PCOS: no Is patient over 69, have PCOS,  family history of premature CHD under age 42, diabetes, smoke, have hypertension or peripheral artery disease:  no At any time, has a partner hit, kicked or otherwise hurt or frightened you?: no Over the past 2 weeks, have you felt down, depressed or hopeless?: no Over the past 2 weeks, have you felt little interest or pleasure in doing things?:no   Gynecologic History Patient's last menstrual period was 08/14/2020. Contraception: none Last Pap: n/a. Results were: n/a Last mammogram: n/a. Results were: n/a  Obstetric History OB History  Gravida Para Term Preterm AB Living  1 1 1     1   SAB IAB Ectopic Multiple Live Births        0 1    # Outcome Date GA Lbr Len/2nd Weight Sex Delivery Anes PTL Lv  1 Term 04/07/20 [redacted]w[redacted]d / 01:29 8 lb 12.2 oz (3.975 kg) M Vag-Spont EPI  LIV    Past Medical History:  Diagnosis Date  . Anemia   . Asthma   . Broken arm   . Iron deficiency   . Multiple allergies     Past Surgical History:  Procedure Laterality Date  . ARM HARDWARE REMOVAL      No current outpatient medications on file. Allergies  Allergen Reactions  . Shrimp [Shellfish Allergy] Anaphylaxis    Pt states she is also allergic to roaches   . Lactose Intolerance (Gi) Diarrhea  . Other Rash    W[employer] sweet and sour sauce caused rash and seafood    Social History   Tobacco Use  . Smoking  status: Current Every Day Smoker    Types: Cigars    Last attempt to quit: 07/14/2019    Years since quitting: 1.1  . Smokeless tobacco: Never Used  Substance Use Topics  . Alcohol use: Not Currently    Family History  Problem Relation Age of Onset  . Asthma Mother   . Asthma Sister   . Eczema Sister   . Urticaria Sister   . Asthma Brother   . Eczema Brother       Review of Systems  Constitutional: negative for fatigue and weight loss Respiratory: negative for cough and wheezing Cardiovascular: negative for chest pain, fatigue and palpitations Gastrointestinal: negative for abdominal pain and change in bowel habits Musculoskeletal:negative for myalgias Neurological: negative for gait problems and tremors Behavioral/Psych: negative for abusive relationship, depression Endocrine: negative for temperature intolerance    Genitourinary:negative for abnormal menstrual periods, genital lesions, hot flashes, sexual problems.  Positive for vaginal discharge Integument/breast: negative for breast lump, breast tenderness, nipple discharge and skin lesion(s)    Objective:       BP 106/70   Pulse 62   Ht 5\' 3"  (1.6 m)   Wt 221 lb 3.2 oz (100.3 kg)   LMP 08/14/2020   BMI 39.18 kg/m  General:   alert and no distress  Skin:   no rash  or abnormalities  Lungs:   clear to auscultation bilaterally  Heart:   regular rate and rhythm, S1, S2 normal, no murmur, click, rub or gallop  Breasts:   normal without suspicious masses, skin or nipple changes or axillary nodes  Abdomen:  normal findings: no organomegaly, soft, non-tender and no hernia  Pelvis:  External genitalia: normal general appearance Urinary system: urethral meatus normal and bladder without fullness, nontender Vaginal: normal without tenderness, induration or masses Cervix: normal appearance Adnexa: normal bimanual exam Uterus: anteverted and non-tender, normal size   Lab Review Urine pregnancy test Labs reviewed  yes Radiologic studies reviewed no  50% of 20 min visit spent on counseling and coordination of care.   Assessment:     1. Encounter for annual routine gynecological examination Rx: - CBC - Comprehensive metabolic panel - TSH  2. Vaginal discharge Rx: - Cervicovaginal ancillary only( [place])  3. Encounter for counseling regarding contraception - declines contraception    Plan:    Education reviewed: calcium supplements, depression evaluation, low fat, low cholesterol diet, safe sex/STD prevention, self breast exams and weight bearing exercise. Follow up in: 1 year.    Orders Placed This Encounter  Procedures  . CBC  . Comprehensive metabolic panel  . TSH    Shelly Bombard, MD 08/29/2020 4:52 PM

## 2020-08-30 LAB — CBC
Hematocrit: 38.7 % (ref 34.0–46.6)
Hemoglobin: 13 g/dL (ref 11.1–15.9)
MCH: 29.3 pg (ref 26.6–33.0)
MCHC: 33.6 g/dL (ref 31.5–35.7)
MCV: 87 fL (ref 79–97)
Platelets: 328 10*3/uL (ref 150–450)
RBC: 4.43 x10E6/uL (ref 3.77–5.28)
RDW: 12 % (ref 11.7–15.4)
WBC: 8.1 10*3/uL (ref 3.4–10.8)

## 2020-08-30 LAB — COMPREHENSIVE METABOLIC PANEL
ALT: 15 IU/L (ref 0–32)
AST: 12 IU/L (ref 0–40)
Albumin/Globulin Ratio: 1.4 (ref 1.2–2.2)
Albumin: 4.4 g/dL (ref 3.9–5.0)
Alkaline Phosphatase: 119 IU/L — ABNORMAL HIGH (ref 42–106)
BUN/Creatinine Ratio: 12 (ref 9–23)
BUN: 9 mg/dL (ref 6–20)
Bilirubin Total: 0.6 mg/dL (ref 0.0–1.2)
CO2: 21 mmol/L (ref 20–29)
Calcium: 9.7 mg/dL (ref 8.7–10.2)
Chloride: 103 mmol/L (ref 96–106)
Creatinine, Ser: 0.75 mg/dL (ref 0.57–1.00)
GFR calc Af Amer: 134 mL/min/{1.73_m2} (ref >59)
GFR calc non Af Amer: 116 mL/min/{1.73_m2} (ref >59)
Globulin, Total: 3.2 g/dL (ref 1.5–4.5)
Glucose: 83 mg/dL (ref 65–99)
Potassium: 4 mmol/L (ref 3.5–5.2)
Sodium: 140 mmol/L (ref 134–144)
Total Protein: 7.6 g/dL (ref 6.0–8.5)

## 2020-08-30 LAB — CERVICOVAGINAL ANCILLARY ONLY
Bacterial Vaginitis (gardnerella): NEGATIVE
Candida Glabrata: NEGATIVE
Candida Vaginitis: NEGATIVE
Chlamydia: NEGATIVE
Comment: NEGATIVE
Comment: NEGATIVE
Comment: NEGATIVE
Comment: NEGATIVE
Comment: NEGATIVE
Comment: NORMAL
Neisseria Gonorrhea: NEGATIVE
Trichomonas: NEGATIVE

## 2020-08-30 LAB — TSH: TSH: 1.02 u[IU]/mL (ref 0.450–4.500)

## 2020-09-08 ENCOUNTER — Encounter: Payer: Self-pay | Admitting: Obstetrics

## 2020-09-08 ENCOUNTER — Other Ambulatory Visit (HOSPITAL_COMMUNITY)
Admission: RE | Admit: 2020-09-08 | Discharge: 2020-09-08 | Disposition: A | Discharge status: Home / Self Care | Payer: Medicaid Other | Source: Ambulatory Visit | Attending: Obstetrics | Admitting: Obstetrics

## 2020-09-08 ENCOUNTER — Ambulatory Visit (INDEPENDENT_AMBULATORY_CARE_PROVIDER_SITE_OTHER): Payer: Medicaid Other | Admitting: Obstetrics

## 2020-09-08 ENCOUNTER — Other Ambulatory Visit: Payer: Self-pay

## 2020-09-08 VITALS — BP 111/77 | HR 76 | Ht 63.0 in | Wt 221.0 lb

## 2020-09-08 DIAGNOSIS — R102 Pelvic and perineal pain: Secondary | ICD-10-CM

## 2020-09-08 DIAGNOSIS — N898 Other specified noninflammatory disorders of vagina: Secondary | ICD-10-CM | POA: Diagnosis present

## 2020-09-08 MED ORDER — METRONIDAZOLE 500 MG PO TABS: 500.0000 mg | ORAL_TABLET | Freq: Two times a day (BID) | ORAL | 2 refills | Status: DC

## 2020-09-08 NOTE — Progress Notes (Signed)
[place] [facility] patient presents for problem visit to rule out possible BV infection.  Pt was just seen for Annual Exam last week. Swab was negative.   CC: Vaginal Discharge and Odor and abdominal pain  Last BM yesterday. Pt states she does not have BM every day

## 2020-09-08 NOTE — Progress Notes (Signed)
Patient ID: Briana Curry, female   DOB: Jun 26, 2001, 20 y.o.   MRN: [id number]  Chief Complaint  Patient presents with  . Vaginitis    HPI Briana Curry is a 20 y.o. female.  Complains of pelvic pain and malodorous vaginal discharge over the past week. HPI  Past Medical History:  Diagnosis Date  . Anemia   . Asthma   . Broken arm   . Iron deficiency   . Multiple allergies     Past Surgical History:  Procedure Laterality Date  . ARM HARDWARE REMOVAL      Family History  Problem Relation Age of Onset  . Asthma Mother   . Asthma Sister   . Eczema Sister   . Urticaria Sister   . Asthma Brother   . Eczema Brother     Social History Social History   Tobacco Use  . Smoking status: Current Every Day Smoker    Types: Cigars    Last attempt to quit: 07/14/2019    Years since quitting: 1.1  . Smokeless tobacco: Never Used  Vaping Use  . Vaping Use: Never used  Substance Use Topics  . Alcohol use: Not Currently  . Drug use: No    Allergies  Allergen Reactions  . Shrimp [Shellfish Allergy] Anaphylaxis    Pt states she is also allergic to roaches   . Lactose Intolerance (Gi) Diarrhea  . Other Rash    W[employer] sweet and sour sauce caused rash and seafood    Current Outpatient Medications  Medication Sig Dispense Refill  . metroNIDAZOLE (FLAGYL) 500 MG tablet Take 1 tablet (500 mg total) by mouth 2 (two) times daily. 14 tablet 2   No current facility-administered medications for this visit.    Review of Systems Review of Systems Constitutional: negative for fatigue and weight loss Respiratory: negative for cough and wheezing Cardiovascular: negative for chest pain, fatigue and palpitations Gastrointestinal: negative for abdominal pain and change in bowel habits Genitourinary: positive for pelvic pain and malodorous vaginal discharge Integument/breast: negative for nipple discharge Musculoskeletal:negative for myalgias Neurological: negative for gait  problems and tremors Behavioral/Psych: negative for abusive relationship, depression Endocrine: negative for temperature intolerance      Blood pressure 111/77, pulse 76, height 5\' 3"  (1.6 m), weight 221 lb (100.2 kg), last menstrual period 08/14/2020, not currently breastfeeding.  Physical Exam Physical Exam           General: Alert and no discharge Abdomen:  normal findings: no organomegaly, soft, non-tender and no hernia  Pelvis:  External genitalia: normal general appearance Urinary system: urethral meatus normal and bladder without fullness, nontender Vaginal: normal without tenderness, induration or masses Cervix: normal appearance Adnexa: normal bimanual exam Uterus: anteverted and non-tender, normal size    50% of 15 min visit spent on counseling and coordination of care.   Data Reviewed Wet Prep and Cultures  Assessment     1. Vaginal discharge Rx: - Cervicovaginal ancillary only( [place]) - metroNIDAZOLE (FLAGYL) 500 MG tablet; Take 1 tablet (500 mg total) by mouth 2 (two) times daily.  Dispense: 14 tablet; Refill: 2  2. Pelvic pain Rx: - US PELVIC COMPLETE WITH TRANSVAGINAL; Future    Plan   Follow up in 2 weeks  Orders Placed This Encounter  Procedures  . US PELVIC COMPLETE WITH TRANSVAGINAL    Standing Status:   Future    Standing Expiration Date:   09/08/2021    Order Specific Question:   Reason for Exam (SYMPTOM  OR DIAGNOSIS REQUIRED)    Answer:   Pelvic pain    Order Specific Question:   Preferred imaging location?    Answer:   [place] ordered this encounter  Medications  . metroNIDAZOLE (FLAGYL) 500 MG tablet    Sig: Take 1 tablet (500 mg total) by mouth 2 (two) times daily.    Dispense:  14 tablet    Refill:  2     Shelly Bombard, MD 09/08/2020 9:39 AM

## 2020-09-11 LAB — CERVICOVAGINAL ANCILLARY ONLY
Bacterial Vaginitis (gardnerella): POSITIVE — AB
Candida Glabrata: NEGATIVE
Candida Vaginitis: NEGATIVE
Chlamydia: NEGATIVE
Comment: NEGATIVE
Comment: NEGATIVE
Comment: NEGATIVE
Comment: NEGATIVE
Comment: NEGATIVE
Comment: NORMAL
Neisseria Gonorrhea: NEGATIVE
Trichomonas: NEGATIVE

## 2020-09-12 ENCOUNTER — Other Ambulatory Visit: Payer: Self-pay | Admitting: Obstetrics

## 2020-09-14 ENCOUNTER — Telehealth: Payer: Self-pay | Admitting: Licensed Clinical Social Worker

## 2020-09-14 NOTE — Telephone Encounter (Signed)
Subjective: Briana Curry completed contraception counseling.  She does not  have a history of any mental health concerns. She is currently sexually active. She is currently using  No method  for birth control. She has had recent STD screening on 06/13/2020. Patient states family  as her support system.   Birth Control History:  None   MDM Patient counseled on all options for birth control today including LARC. Patient desires no method  initiated for birth control   Assessment:  20 y.o. female considering no method  for birth control  Plan: No further plan  Lynnea Ferrier, Marlinda Mike 09/14/2020 1:47 PM

## 2020-09-21 ENCOUNTER — Inpatient Hospital Stay: Admission: RE | Admit: 2020-09-21 | Payer: Medicaid Other | Source: Ambulatory Visit

## 2020-09-21 IMAGING — CR DG CHEST 2V
1 series · 2 of 2 positions shown · non-contrast
Comparison: 08/07/2013

CLINICAL DATA: Dyspnea and emesis

EXAM:
CHEST - 2 VIEW

[Series 1: w chest pa · 0.14mm/px · 2 of 2 slices shown]
[im 1/2]
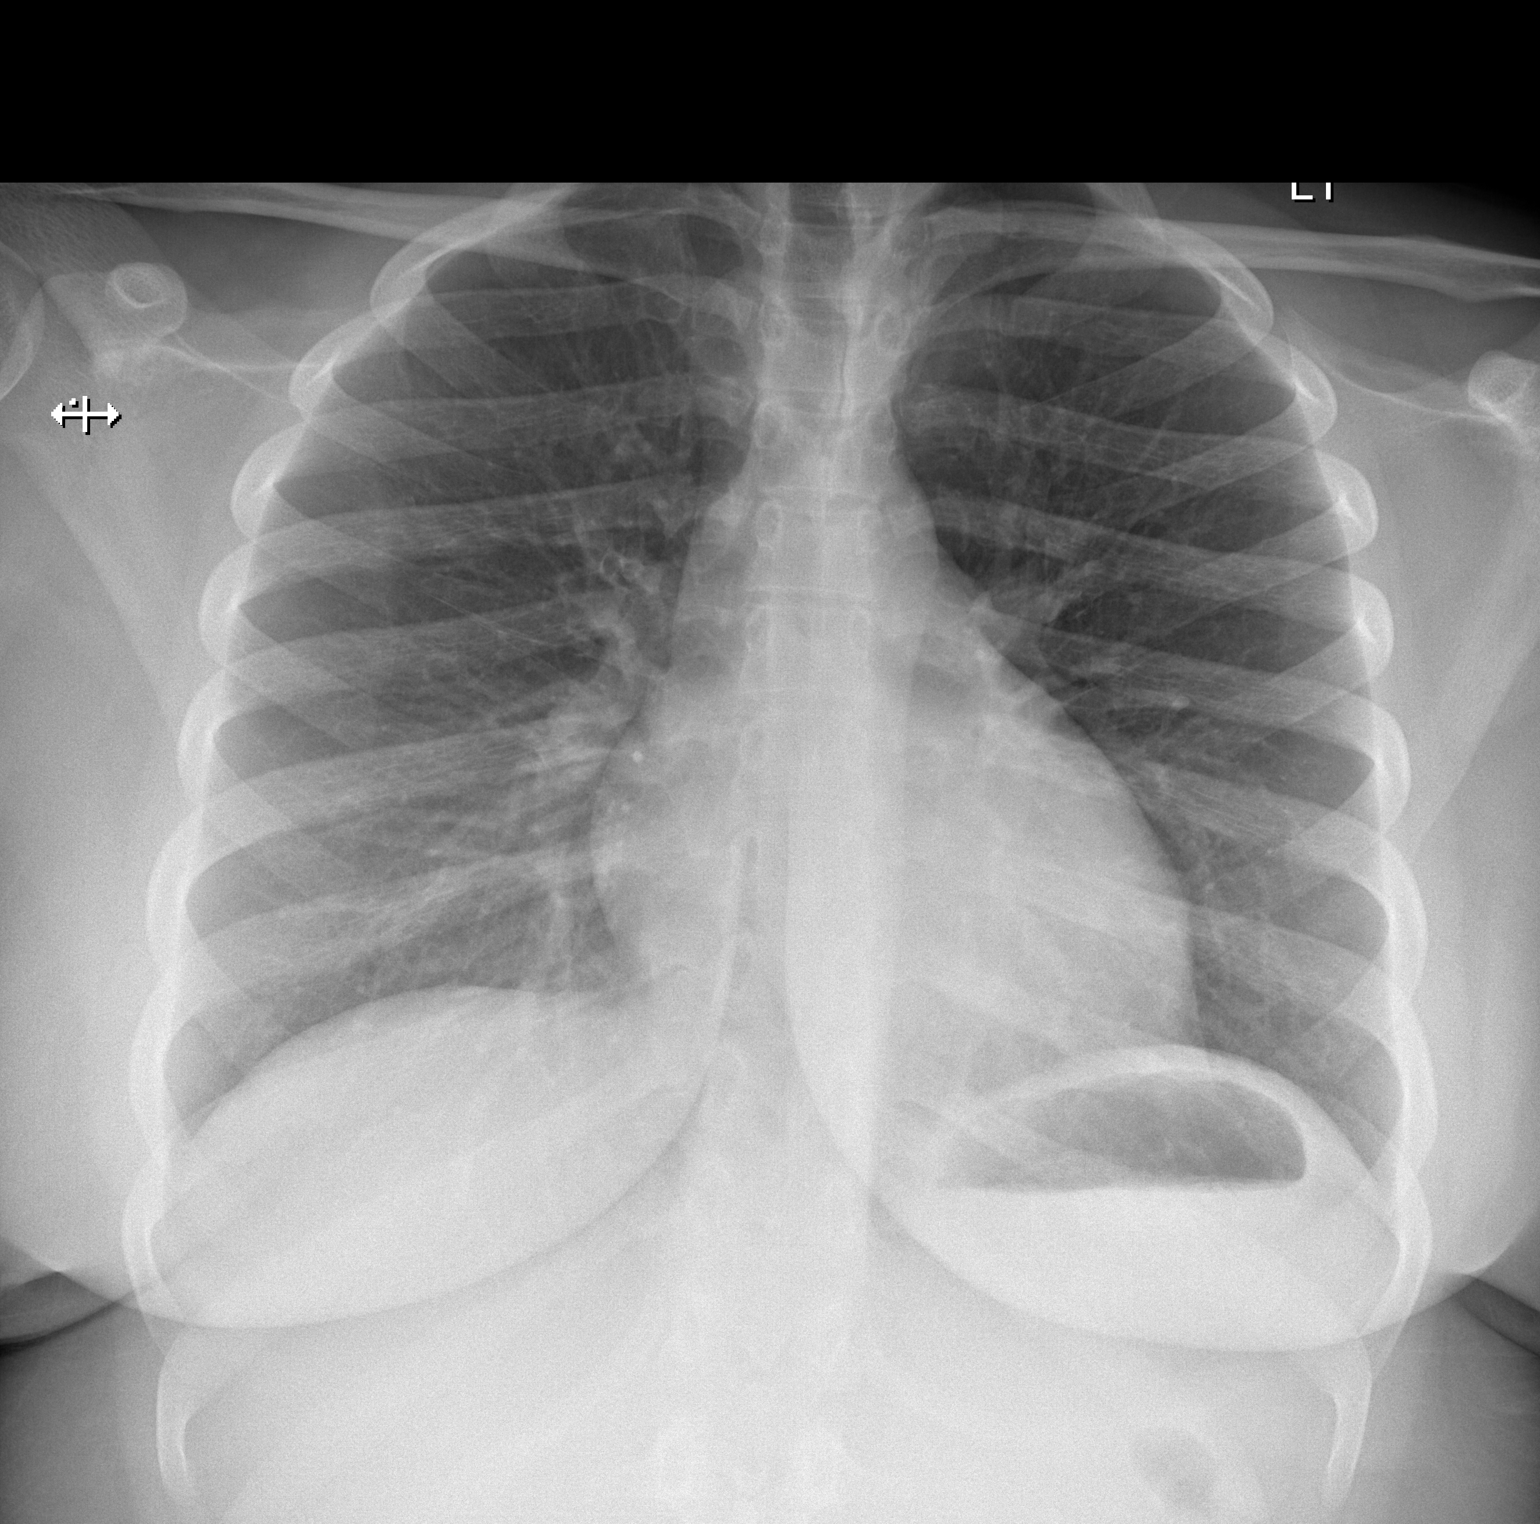
[im 2/2]
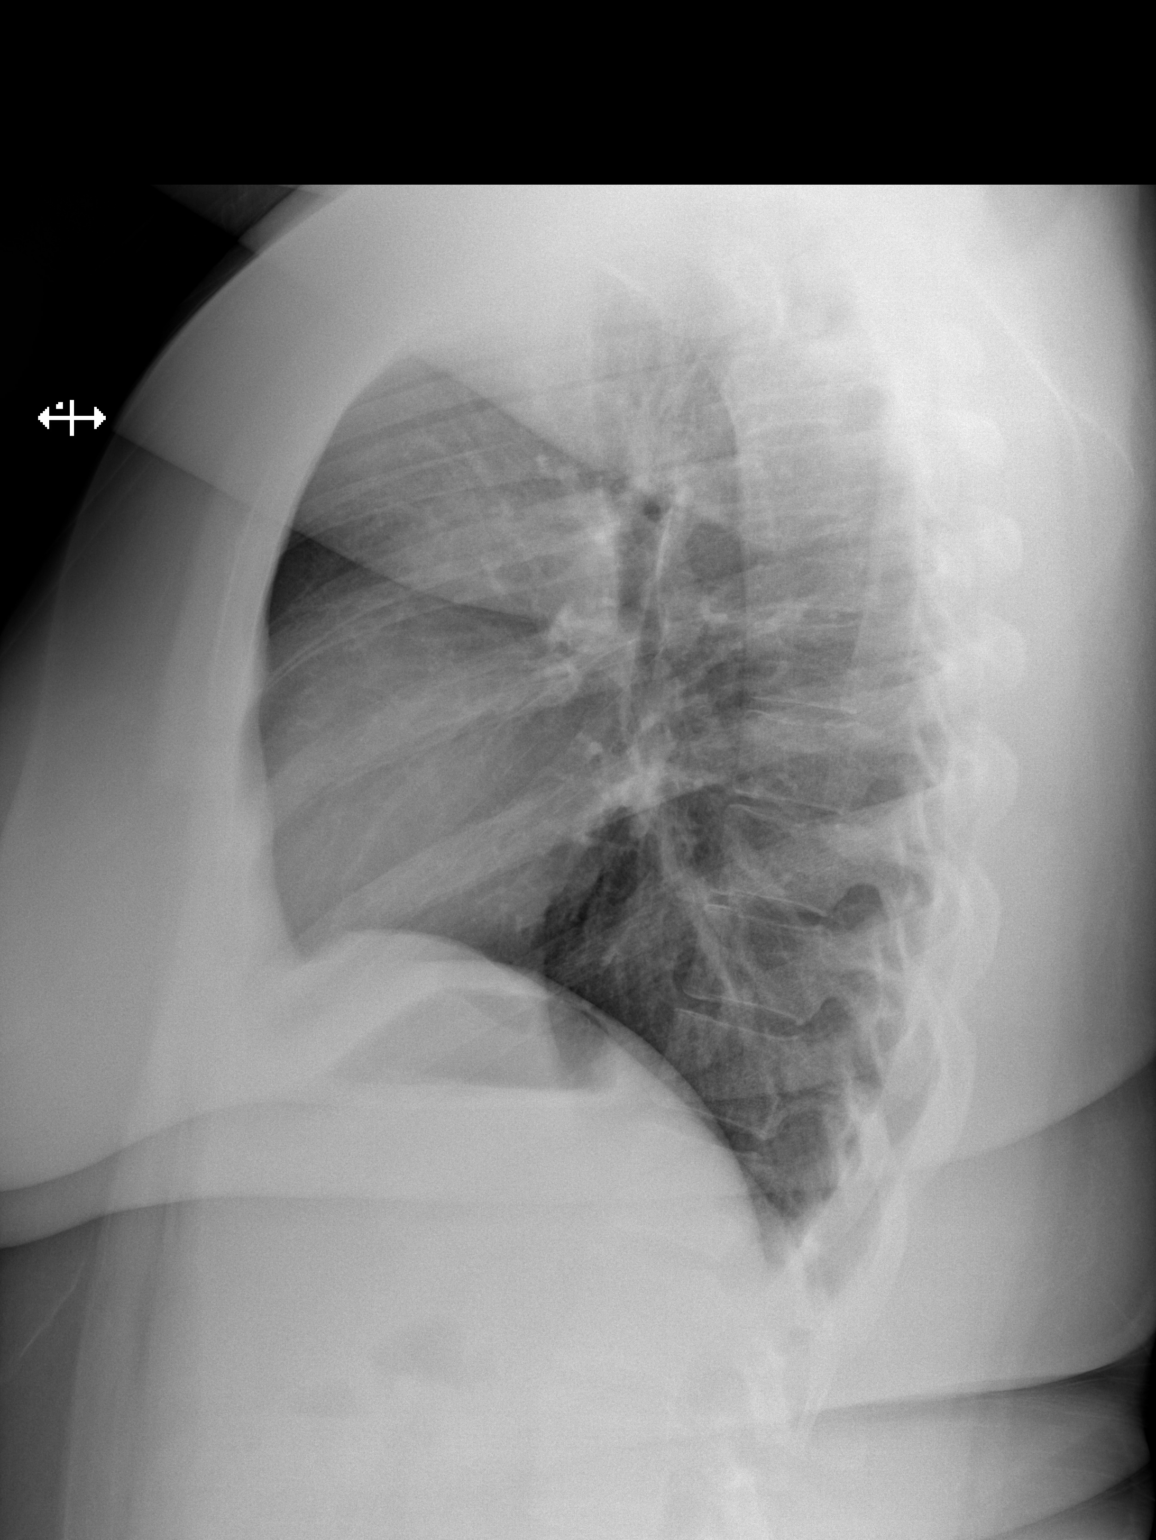

[2 of 2 positions shown; findings below may reference images not displayed]

FINDINGS: The heart size and mediastinal contours are within normal limits.
Both lungs are clear. The visualized skeletal structures are
unremarkable.
IMPRESSION: No active cardiopulmonary disease.

## 2020-09-22 ENCOUNTER — Telehealth: Payer: Medicaid Other | Admitting: Obstetrics

## 2020-09-22 ENCOUNTER — Telehealth: Payer: Self-pay

## 2020-09-22 NOTE — Telephone Encounter (Signed)
No answer for virtual visit with Dr. Jodi Mourning today Pt no showed for u/s yesterday LVM for pt to c/b x2

## 2020-10-18 ENCOUNTER — Other Ambulatory Visit: Payer: Self-pay

## 2020-10-18 ENCOUNTER — Ambulatory Visit
Admission: RE | Admit: 2020-10-18 | Discharge: 2020-10-18 | Disposition: A | Discharge status: Home / Self Care | Payer: Medicaid Other | Source: Ambulatory Visit | Attending: Obstetrics | Admitting: Obstetrics

## 2020-10-18 DIAGNOSIS — R102 Pelvic and perineal pain: Secondary | ICD-10-CM | POA: Diagnosis present

## 2020-10-23 ENCOUNTER — Other Ambulatory Visit: Payer: Self-pay | Admitting: Obstetrics

## 2020-10-23 DIAGNOSIS — N939 Abnormal uterine and vaginal bleeding, unspecified: Secondary | ICD-10-CM

## 2020-10-30 ENCOUNTER — Other Ambulatory Visit: Payer: Medicaid Other
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
## 2021-01-05 ENCOUNTER — Other Ambulatory Visit: Payer: Self-pay | Admitting: Internal Medicine

## 2021-01-06 LAB — CBC
HCT: 39.5 % (ref 35.0–45.0)
Hemoglobin: 13.1 g/dL (ref 11.7–15.5)
MCH: 29.4 pg (ref 27.0–33.0)
MCHC: 33.2 g/dL (ref 32.0–36.0)
MCV: 88.8 fL (ref 80.0–100.0)
MPV: 10.2 fL (ref 7.5–12.5)
Platelets: 298 10*3/uL (ref 140–400)
RBC: 4.45 10*6/uL (ref 3.80–5.10)
RDW: 11.8 % (ref 11.0–15.0)
WBC: 7.6 10*3/uL (ref 3.8–10.8)

## 2021-01-06 LAB — LIPID PANEL
Cholesterol: 152 mg/dL (ref <170)
HDL: 36 mg/dL — ABNORMAL LOW (ref >45)
LDL Cholesterol (Calc): 107 mg/dL (calc) (ref <110)
Non-HDL Cholesterol (Calc): 116 mg/dL (calc) (ref <120)
Total CHOL/HDL Ratio: 4.2 (calc) (ref <5.0)
Triglycerides: 29 mg/dL (ref <90)

## 2021-01-06 LAB — COMPLETE METABOLIC PANEL WITH GFR
AG Ratio: 1.4 (calc) (ref 1.0–2.5)
ALT: 14 U/L (ref 5–32)
AST: 14 U/L (ref 12–32)
Albumin: 4.3 g/dL (ref 3.6–5.1)
Alkaline phosphatase (APISO): 93 U/L (ref 36–128)
BUN: 12 mg/dL (ref 7–20)
CO2: 23 mmol/L (ref 20–32)
Calcium: 9.8 mg/dL (ref 8.9–10.4)
Chloride: 106 mmol/L (ref 98–110)
Creat: 0.64 mg/dL (ref 0.50–1.00)
GFR, Est African American: 150 mL/min/{1.73_m2} (ref >=60)
GFR, Est Non African American: 129 mL/min/{1.73_m2} (ref >=60)
Globulin: 3.1 g/dL (calc) (ref 2.0–3.8)
Glucose, Bld: 78 mg/dL (ref 65–99)
Potassium: 4.1 mmol/L (ref 3.8–5.1)
Sodium: 140 mmol/L (ref 135–146)
Total Bilirubin: 0.8 mg/dL (ref 0.2–1.1)
Total Protein: 7.4 g/dL (ref 6.3–8.2)

## 2021-01-06 LAB — VITAMIN D 25 HYDROXY (VIT D DEFICIENCY, FRACTURES): Vit D, 25-Hydroxy: 22 ng/mL — ABNORMAL LOW (ref 30–100)

## 2021-01-06 LAB — TSH: TSH: 0.67 mIU/L

## 2021-01-11 ENCOUNTER — Other Ambulatory Visit: Payer: Self-pay

## 2021-01-11 ENCOUNTER — Ambulatory Visit (HOSPITAL_COMMUNITY)
Admission: EM | Admit: 2021-01-11 | Discharge: 2021-01-11 | Disposition: A | Discharge status: Home / Self Care | Payer: Medicaid Other | Attending: Emergency Medicine | Admitting: Emergency Medicine

## 2021-01-11 ENCOUNTER — Encounter (HOSPITAL_COMMUNITY): Payer: Self-pay | Admitting: Emergency Medicine

## 2021-01-11 DIAGNOSIS — Z113 Encounter for screening for infections with a predominantly sexual mode of transmission: Secondary | ICD-10-CM | POA: Insufficient documentation

## 2021-01-11 LAB — HIV ANTIBODY (ROUTINE TESTING W REFLEX): HIV Screen 4th Generation wRfx: NONREACTIVE

## 2021-01-11 NOTE — ED Triage Notes (Signed)
requesting std evaluation.  Vaginal discharge for 2 days

## 2021-01-11 NOTE — ED Provider Notes (Signed)
[place]    CSN: [id number] Arrival date & time: 01/11/21  1305      History   Chief Complaint Chief Complaint  Patient presents with  . SEXUALLY TRANSMITTED DISEASE    HPI Briana Curry is a 20 y.o. female.   Patient presents with thick Manmeet Arzola discharge for two days with mild odor and frequency. Denies itching, dysuria,  irritation, urgency, abdominal pain, flank pain, rash, lesion, known exposure. 1 partner, no condom use.   Past Medical History:  Diagnosis Date  . Anemia   . Asthma   . Broken arm   . Iron deficiency   . Multiple allergies     Patient Active Problem List   Diagnosis Date Noted  . Obesity (BMI 30-39.9) 09/09/2019  . Adverse food reaction 09/21/2018  . Other allergic rhinitis 09/21/2018  . Urticaria 11/03/2017  . Lactose intolerance 11/03/2017  . BMI 40.0-44.9, adult ([place]) 07/25/2016  . Rash and other nonspecific skin eruption 07/25/2016    Past Surgical History:  Procedure Laterality Date  . ARM HARDWARE REMOVAL      OB History    Gravida  1   Para  1   Term  1   Preterm      AB      Living  1     SAB      IAB      Ectopic      Multiple  0   Live Births  1            Home Medications    Prior to Admission medications   Medication Sig Start Date End Date Taking? Authorizing Provider  metroNIDAZOLE (FLAGYL) 500 MG tablet Take 1 tablet (500 mg total) by mouth 2 (two) times daily. Patient not taking: Reported on 01/11/2021 09/08/20   Shelly Bombard, MD    Family History Family History  Problem Relation Age of Onset  . Asthma Mother   . Asthma Sister   . Eczema Sister   . Urticaria Sister   . Asthma Brother   . Eczema Brother     Social History Social History   Tobacco Use  . Smoking status: Current Every Day Smoker    Types: Cigars    Last attempt to quit: 07/14/2019    Years since quitting: 1.4  . Smokeless tobacco: Never Used  Vaping Use  . Vaping Use: Never used  Substance Use  Topics  . Alcohol use: Yes  . Drug use: No     Allergies   Shrimp [shellfish allergy], Lactose intolerance (gi), and Other   Review of Systems Review of Systems  Defer to HPI    Physical Exam Triage Vital Signs ED Triage Vitals  Enc Vitals Group     BP 01/11/21 1332 113/70     Pulse Rate 01/11/21 1332 95     Resp 01/11/21 1332 20     Temp --      Temp src --      SpO2 01/11/21 1332 98 %     Weight --      Height --      Head Circumference --      Peak Flow --      Pain Score 01/11/21 1330 0     Pain Loc --      Pain Edu? --      Excl. in [place]? --    No data found.  Updated Vital Signs BP 113/70 (BP Location: Left Arm) Comment (  BP Location): large  Pulse 95   Resp 20   LMP 12/12/2020   SpO2 98%   Visual Acuity Right Eye Distance:   Left Eye Distance:   Bilateral Distance:    Right Eye Near:   Left Eye Near:    Bilateral Near:     Physical Exam Constitutional:      Appearance: Normal appearance. She is obese.  HENT:     Head: Normocephalic.  Eyes:     Extraocular Movements: Extraocular movements intact.  Pulmonary:     Effort: Pulmonary effort is normal.  Genitourinary:    Comments: Deferred exam, self collect for vaginal swab Musculoskeletal:        General: Normal range of motion.     Cervical back: Normal range of motion.  Skin:    General: Skin is warm and dry.  Neurological:     Mental Status: She is alert and oriented to person, place, and time. Mental status is at baseline.  Psychiatric:        Mood and Affect: Mood normal.        Behavior: Behavior normal.        Thought Content: Thought content normal.        Judgment: Judgment normal.      UC Treatments / Results  Labs (all labs ordered are listed, but only abnormal results are displayed) Labs Reviewed  HIV ANTIBODY (ROUTINE TESTING W REFLEX)  RPR  CERVICOVAGINAL ANCILLARY ONLY    EKG   Radiology No results found.  Procedures Procedures (including critical care  time)  Medications Ordered in UC Medications - No data to display  Initial Impression / Assessment and Plan / UC Course  I have reviewed the triage vital signs and the nursing notes.  Pertinent labs & imaging results that were available during my care of the patient were reviewed by me and considered in my medical decision making (see chart for details).  Routine screening for sti  1. sti screen pending 2-3 days, will treat per protocol 2. Advised abstinence until labs results and/or treatment complete  Final Clinical Impressions(s) / UC Diagnoses   Final diagnoses:  Routine screening for STI (sexually transmitted infection)     Discharge Instructions     Labs pending 2-3 days, will be called if positive for treatment  Do not have sex until labs result, if positive do not have sex until treatment complete, if positive please notify so they may be treated as well    ED Prescriptions    None     PDMP not reviewed this encounter.   Hans Eden, NP 01/11/21 1409

## 2021-01-11 NOTE — Discharge Instructions (Signed)
Labs pending 2-3 days, will be called if positive for treatment  Do not have sex until labs result, if positive do not have sex until treatment complete, if positive please notify so they may be treated as well

## 2021-01-12 ENCOUNTER — Telehealth (HOSPITAL_COMMUNITY): Payer: Self-pay | Admitting: Emergency Medicine

## 2021-01-12 LAB — CERVICOVAGINAL ANCILLARY ONLY
Bacterial Vaginitis (gardnerella): POSITIVE — AB
Candida Glabrata: NEGATIVE
Candida Vaginitis: NEGATIVE
Chlamydia: NEGATIVE
Comment: NEGATIVE
Comment: NEGATIVE
Comment: NEGATIVE
Comment: NEGATIVE
Comment: NEGATIVE
Comment: NORMAL
Neisseria Gonorrhea: NEGATIVE
Trichomonas: NEGATIVE

## 2021-01-12 LAB — RPR: RPR Ser Ql: NONREACTIVE

## 2021-01-12 MED ORDER — METRONIDAZOLE 500 MG PO TABS: 500.0000 mg | ORAL_TABLET | Freq: Two times a day (BID) | ORAL | 0 refills | Status: DC

## 2021-05-08 ENCOUNTER — Other Ambulatory Visit: Payer: Self-pay | Admitting: Internal Medicine

## 2021-05-09 LAB — C. TRACHOMATIS/N. GONORRHOEAE RNA
C. trachomatis RNA, TMA: NOT DETECTED
N. gonorrhoeae RNA, TMA: NOT DETECTED

## 2021-05-09 LAB — HCG, SERUM, QUALITATIVE: Preg, Serum: POSITIVE — AB

## 2021-05-09 LAB — EXTRA LAV TOP TUBE

## 2021-05-11 ENCOUNTER — Encounter (HOSPITAL_COMMUNITY): Payer: Self-pay | Admitting: Obstetrics and Gynecology

## 2021-05-11 ENCOUNTER — Other Ambulatory Visit: Payer: Self-pay

## 2021-05-11 ENCOUNTER — Inpatient Hospital Stay (HOSPITAL_COMMUNITY)
Admission: AD | Admit: 2021-05-11 | Discharge: 2021-05-11 | Disposition: A | Discharge status: Home / Self Care | Payer: Medicaid Other | Attending: Obstetrics and Gynecology | Admitting: Obstetrics and Gynecology

## 2021-05-11 ENCOUNTER — Inpatient Hospital Stay (HOSPITAL_COMMUNITY): Payer: Medicaid Other

## 2021-05-11 DIAGNOSIS — R1013 Epigastric pain: Secondary | ICD-10-CM | POA: Insufficient documentation

## 2021-05-11 DIAGNOSIS — Z87891 Personal history of nicotine dependence: Secondary | ICD-10-CM | POA: Diagnosis not present

## 2021-05-11 DIAGNOSIS — Z3A01 Less than 8 weeks gestation of pregnancy: Secondary | ICD-10-CM

## 2021-05-11 DIAGNOSIS — B9689 Other specified bacterial agents as the cause of diseases classified elsewhere: Secondary | ICD-10-CM

## 2021-05-11 DIAGNOSIS — N760 Acute vaginitis: Secondary | ICD-10-CM

## 2021-05-11 DIAGNOSIS — R110 Nausea: Secondary | ICD-10-CM | POA: Diagnosis not present

## 2021-05-11 DIAGNOSIS — O26899 Other specified pregnancy related conditions, unspecified trimester: Secondary | ICD-10-CM

## 2021-05-11 DIAGNOSIS — O26891 Other specified pregnancy related conditions, first trimester: Secondary | ICD-10-CM | POA: Diagnosis not present

## 2021-05-11 LAB — CBC
HCT: 38.5 % (ref 36.0–46.0)
Hemoglobin: 13.3 g/dL (ref 12.0–15.0)
MCH: 30.2 pg (ref 26.0–34.0)
MCHC: 34.5 g/dL (ref 30.0–36.0)
MCV: 87.5 fL (ref 80.0–100.0)
Platelets: 260 10*3/uL (ref 150–400)
RBC: 4.4 MIL/uL (ref 3.87–5.11)
RDW: 12 % (ref 11.5–15.5)
WBC: 8.4 10*3/uL (ref 4.0–10.5)
nRBC: 0 % (ref 0.0–0.2)

## 2021-05-11 LAB — WET PREP, GENITAL
Sperm: NONE SEEN
Trich, Wet Prep: NONE SEEN
Yeast Wet Prep HPF POC: NONE SEEN

## 2021-05-11 LAB — URINALYSIS, ROUTINE W REFLEX MICROSCOPIC
Bilirubin Urine: NEGATIVE
Glucose, UA: NEGATIVE mg/dL
Hgb urine dipstick: NEGATIVE
Ketones, ur: NEGATIVE mg/dL
Leukocytes,Ua: NEGATIVE
Nitrite: NEGATIVE
Protein, ur: NEGATIVE mg/dL
Specific Gravity, Urine: 1.014 (ref 1.005–1.030)
pH: 6 (ref 5.0–8.0)

## 2021-05-11 LAB — POCT PREGNANCY, URINE: Preg Test, Ur: POSITIVE — AB

## 2021-05-11 LAB — HCG, QUANTITATIVE, PREGNANCY: hCG, Beta Chain, Quant, S: 24359 m[IU]/mL — ABNORMAL HIGH (ref <5)

## 2021-05-11 MED ORDER — METRONIDAZOLE 500 MG PO TABS: 500.0000 mg | ORAL_TABLET | Freq: Two times a day (BID) | ORAL | 0 refills | Status: DC

## 2021-05-11 MED ORDER — ACETAMINOPHEN 500 MG PO TABS: 1000.0000 mg | ORAL_TABLET | Freq: Once | ORAL | Status: DC

## 2021-05-11 MED ORDER — CALCIUM CARBONATE ANTACID 500 MG PO CHEW
400.0000 mg | CHEWABLE_TABLET | Freq: Once | ORAL | Status: AC
  Administered 2021-05-11: 400 mg via ORAL
  Filled 2021-05-11: qty 2

## 2021-05-11 MED ORDER — ONDANSETRON 4 MG PO TBDP
4.0000 mg | ORAL_TABLET | Freq: Once | ORAL | Status: AC
  Administered 2021-05-11: 4 mg via ORAL
  Filled 2021-05-11: qty 1

## 2021-05-11 MED ORDER — METRONIDAZOLE 0.75 % VA GEL: 1.0000 | Freq: Every day | VAGINAL | 0 refills | Status: DC

## 2021-05-11 NOTE — MAU Provider Note (Signed)
History     CSN: [id number]  Arrival date and time: 05/11/21 1744   Event Date/Time   First Provider Initiated Contact with Patient 05/11/21 2054      Chief Complaint  Patient presents with   Abdominal Pain   Briana Curry is a 20 y.o. G2P1001 at [redacted]w[redacted]d by Definite LMP og Aug 9th, 2022 who  plans to receive care at [facility].  She presents today for Abdominal Pain.  She states she has been having upper abdominal pain for the past week that she describes as aching.  She states it is constant and has no aggravating or relieving factors.  She states she tries to eat to reduce the discomfort, but it makes her queasy.  She rates the pain a 7/10.  She states "it feels like I have a virus."  She states she has been exposed to Covid at her job, but has not been tested or around other sick individuals. She denies vaginal bleeding or leaking, but reports vaginal discharge that she thinks may be a yeast infection.    OB History     Gravida  2   Para  1   Term  1   Preterm      AB      Living  1      SAB      IAB      Ectopic      Multiple  0   Live Births  1           Past Medical History:  Diagnosis Date   Anemia    Asthma    Broken arm    Iron deficiency    Multiple allergies     Past Surgical History:  Procedure Laterality Date   ARM HARDWARE REMOVAL      Family History  Problem Relation Age of Onset   Asthma Mother    Asthma Sister    Eczema Sister    Urticaria Sister    Asthma Brother    Eczema Brother     Social History   Tobacco Use   Smoking status: Every Day    Types: Cigars    Last attempt to quit: 07/14/2019    Years since quitting: 1.8   Smokeless tobacco: Never  Vaping Use   Vaping Use: Never used  Substance Use Topics   Alcohol use: Yes   Drug use: No    Allergies:  Allergies  Allergen Reactions   Shrimp [Shellfish Allergy] Anaphylaxis    Pt states she is also allergic to roaches    Lactose Intolerance (Gi) Diarrhea    Other Rash    Wendy's sweet and sour sauce caused rash and seafood    Medications Prior to Admission  Medication Sig Dispense Refill Last Dose   metroNIDAZOLE (FLAGYL) 500 MG tablet Take 1 tablet (500 mg total) by mouth 2 (two) times daily. 14 tablet 0     Review of Systems  Gastrointestinal:  Positive for abdominal pain, constipation, diarrhea and nausea. Negative for vomiting.  Genitourinary:  Positive for vaginal discharge. Negative for difficulty urinating, dysuria and vaginal bleeding.  Neurological:  Positive for dizziness, light-headedness and headaches.  Physical Exam   Blood pressure 108/61, pulse 75, temperature 98.3 F (36.8 C), temperature source Oral, resp. rate 18, height 5\' 7"  (1.702 m), weight 92.6 kg, last menstrual period 03/27/2021, SpO2 100 %, not currently breastfeeding.  Physical Exam Vitals reviewed.  Constitutional:      Appearance: Normal appearance. She is  well-developed.  HENT:     Head: Normocephalic and atraumatic.  Eyes:     Conjunctiva/sclera: Conjunctivae normal.  Cardiovascular:     Rate and Rhythm: Normal rate.  Pulmonary:     Effort: Pulmonary effort is normal. No respiratory distress.  Abdominal:     Tenderness: There is abdominal tenderness in the epigastric area.  Musculoskeletal:        General: Normal range of motion.     Cervical back: Normal range of motion.  Skin:    General: Skin is warm and dry.  Neurological:     Mental Status: She is alert.  Psychiatric:        Mood and Affect: Mood normal.        Thought Content: Thought content normal.    MAU Course  Procedures Results for orders placed or performed during the hospital encounter of 05/11/21 (from the past 24 hour(s))  Pregnancy, urine POC     Status: Abnormal   Collection Time: 05/11/21  6:19 PM  Result Value Ref Range   Preg Test, Ur POSITIVE (A) NEGATIVE  Wet prep, genital     Status: Abnormal   Collection Time: 05/11/21  6:35 PM   Specimen: PATH Cytology  Cervicovaginal Ancillary Only  Result Value Ref Range   Yeast Wet Prep HPF POC NONE SEEN NONE SEEN   Trich, Wet Prep NONE SEEN NONE SEEN   Clue Cells Wet Prep HPF POC PRESENT (A) NONE SEEN   WBC, Wet Prep HPF POC MANY (A) NONE SEEN   Sperm NONE SEEN   CBC     Status: None   Collection Time: 05/11/21  6:39 PM  Result Value Ref Range   WBC 8.4 4.0 - 10.5 K/uL   RBC 4.40 3.87 - 5.11 MIL/uL   Hemoglobin 13.3 12.0 - 15.0 g/dL   HCT 38.5 36.0 - 46.0 %   MCV 87.5 80.0 - 100.0 fL   MCH 30.2 26.0 - 34.0 pg   MCHC 34.5 30.0 - 36.0 g/dL   RDW 12.0 11.5 - 15.5 %   Platelets 260 150 - 400 K/uL   nRBC 0.0 0.0 - 0.2 %  hCG, quantitative, pregnancy     Status: Abnormal   Collection Time: 05/11/21  6:39 PM  Result Value Ref Range   hCG, Beta Chain, Quant, S 24,359 (H) <5 mIU/mL  Urinalysis, Routine w reflex microscopic Urine, Clean Catch     Status: None   Collection Time: 05/11/21  7:00 PM  Result Value Ref Range   Color, Urine YELLOW YELLOW   APPearance CLEAR CLEAR   Specific Gravity, Urine 1.014 1.005 - 1.030   pH 6.0 5.0 - 8.0   Glucose, UA NEGATIVE NEGATIVE mg/dL   Hgb urine dipstick NEGATIVE NEGATIVE   Bilirubin Urine NEGATIVE NEGATIVE   Ketones, ur NEGATIVE NEGATIVE mg/dL   Protein, ur NEGATIVE NEGATIVE mg/dL   Nitrite NEGATIVE NEGATIVE   Leukocytes,Ua NEGATIVE NEGATIVE   US OB LESS THAN 14 WEEKS WITH OB TRANSVAGINAL  Result Date: 05/11/2021 CLINICAL DATA:  Pelvic pain.  First trimester of pregnancy. EXAM: OBSTETRIC <14 WK [place] AND TRANSVAGINAL OB US TECHNIQUE: Both transabdominal and transvaginal ultrasound examinations were performed for complete evaluation of the gestation as well as the maternal uterus, adnexal regions, and pelvic cul-de-sac. Transvaginal technique was performed to assess early pregnancy. COMPARISON:  None. FINDINGS: Intrauterine gestational sac: Single Yolk sac:  Visualized. Embryo:  Visualized. Cardiac Activity: Visualized. Heart Rate: 99 bpm CRL:  2.8 mm   5 w  5  d                  [place] [facility]: Jan 06, 2022. Subchorionic hemorrhage:  None visualized. Maternal uterus/adnexae: Ovaries are unremarkable. No free fluid is noted. IMPRESSION: Single live intrauterine gestation of 5 weeks 5 days. Electronically Signed   By: Marijo Conception M.D.   On: 05/11/2021 20:14    MDM Pelvic Exam; Wet Prep and GC/CT Labs: UA, UPT, CBC, hCG, ABO Ultrasound Assessment and Plan  20 year old G2P1001 SIUP at 6.3 weeks Epigastric Pain Nausea  -Labs and [place] completed while patient in triage. -Provider to bedside to discuss results and perform assessment. -Discussed need for follow up for fetal bradycardia. -Will send order for outpatient [place] at [facility] and accepts Zofran for Nausea. -Discussed pain c/o and informed likely reflux. -Will give Tums and if improved with discharge. -If not improved, will give tylenol.   Maryann Conners 05/11/2021, 8:54 PM   Reassessment (9:42 PM)  -Nurse reports patient with improvement of pain with Tums dosing. -Encouraged to call primary office or return to MAU if symptoms worsen or with the onset of new symptoms. -Discharged to home in stable condition.  Maryann Conners MSN, CNM Advanced Practice Provider, [facility] [employer]

## 2021-05-11 NOTE — MAU Note (Signed)
Presents c/o abdominal pain, states pain is in upper abdomen and began approximately 1 week ago.  Denies VB.  LMP 03/27/2021.  Reports +HPT.

## 2021-05-14 LAB — GC/CHLAMYDIA PROBE AMP (~~LOC~~) NOT AT ARMC
Chlamydia: NEGATIVE
Comment: NEGATIVE
Comment: NORMAL
Neisseria Gonorrhea: NEGATIVE

## 2021-05-29 ENCOUNTER — Ambulatory Visit (INDEPENDENT_AMBULATORY_CARE_PROVIDER_SITE_OTHER): Payer: Medicaid Other

## 2021-05-29 ENCOUNTER — Other Ambulatory Visit: Payer: Self-pay

## 2021-05-29 VITALS — BP 108/73 | HR 87 | Ht 63.0 in | Wt 201.3 lb

## 2021-05-29 DIAGNOSIS — O219 Vomiting of pregnancy, unspecified: Secondary | ICD-10-CM

## 2021-05-29 DIAGNOSIS — Z3481 Encounter for supervision of other normal pregnancy, first trimester: Secondary | ICD-10-CM

## 2021-05-29 DIAGNOSIS — Z3491 Encounter for supervision of normal pregnancy, unspecified, first trimester: Secondary | ICD-10-CM | POA: Insufficient documentation

## 2021-05-29 DIAGNOSIS — Z3490 Encounter for supervision of normal pregnancy, unspecified, unspecified trimester: Secondary | ICD-10-CM

## 2021-05-29 DIAGNOSIS — O3680X0 Pregnancy with inconclusive fetal viability, not applicable or unspecified: Secondary | ICD-10-CM

## 2021-05-29 HISTORY — DX: Encounter for supervision of normal pregnancy, unspecified, unspecified trimester: Z34.90

## 2021-05-29 MED ORDER — BLOOD PRESSURE KIT DEVI: 1.0000 | 0 refills | Status: DC

## 2021-05-29 MED ORDER — PROMETHAZINE HCL 25 MG PO TABS: 25.0000 mg | ORAL_TABLET | Freq: Four times a day (QID) | ORAL | 2 refills | Status: DC | PRN

## 2021-05-29 MED ORDER — GOJJI WEIGHT SCALE MISC: 1.0000 | 0 refills | Status: DC

## 2021-05-29 MED ORDER — PRENATAL MULTIVITAMIN CH: 1.0000 | ORAL_TABLET | Freq: Every day | ORAL | Status: DC

## 2021-05-29 NOTE — Progress Notes (Signed)
New OB Intake  I connected with  Briana Curry on 05/29/21 at  1:15 PM EDT by in person and verified that I am speaking with the correct person using two identifiers. Nurse is located at [facility] and pt is located at [place].  I discussed the limitations, risks, security and privacy concerns of performing an evaluation and management service by telephone and the availability of in person appointments. I also discussed with the patient that there may be a patient responsible charge related to this service. The patient expressed understanding and agreed to proceed.  I explained I am completing New OB Intake today. We discussed her EDD of 01/01/21 that is based on LMP of 03/27/21. Pt is G2/P1001. I reviewed her allergies, medications, Medical/Surgical/OB history, and appropriate screenings. I informed her of [facility] services. Based on history, this is a/an  pregnancy uncomplicated .   Patient Active Problem List   Diagnosis Date Noted   Obesity (BMI 30-39.9) 09/09/2019   Adverse food reaction 09/21/2018   Other allergic rhinitis 09/21/2018   Urticaria 11/03/2017   Lactose intolerance 11/03/2017   BMI 40.0-44.9, adult ([place]) 07/25/2016   Rash and other nonspecific skin eruption 07/25/2016    Concerns addressed today  Delivery Plans:  Plans to deliver at [facility].   MyChart/Babyscripts MyChart access verified. I explained pt will have some visits in office and some virtually. Babyscripts instructions given and order placed. Patient verifies receipt of registration text/e-mail. Account successfully created and app downloaded.  Blood Pressure Cuff  Blood pressure cuff ordered for patient to pick-up from [employer]. Explained after first prenatal appt pt will check weekly and document in 46.  Weight scale: Patient does not have weight scale. Weight scale ordered for patient to pick up form [facility].   Anatomy US Explained first scheduled [place] will be around 19 weeks. Scan  performed today to reassess fetal bradycardia.  Labs Discussed Johnsie Cancel genetic screening with patient. Would like both Panorama and Horizon drawn at new OB visit. Routine prenatal labs needed.  Covid Vaccine Patient has covid vaccine.   Mother/ Baby Dyad Candidate?    If yes, offer as possibility  Informed patient of [facility] website  and placed link in her AVS.   Social Determinants of Health Food Insecurity: Patient denies food insecurity. WIC Referral: Patient is interested in referral to [facility].  Transportation: Patient denies transportation needs. Childcare: Discussed no children allowed at ultrasound appointments. Offered childcare services; patient expresses need for childcare services. Childcare scheduled for appropriate appointments and information given to patient.  Send link to Pregnancy Navigators   Placed OB Box on problem list and updated  First visit review I reviewed new OB appt with pt. I explained she will have a pelvic exam, ob bloodwork with genetic screening, and PAP smear. Explained pt will be seen by Baltazar Najjar at first visit; encounter routed to appropriate provider. Explained that patient will be seen by pregnancy navigator following visit with provider. [facility] information placed in AVS.   Lucianne Lei, RN 05/29/2021  1:16 PM

## 2021-06-01 ENCOUNTER — Other Ambulatory Visit: Payer: Self-pay | Admitting: Family Medicine

## 2021-06-01 MED ORDER — PRENATAL PLUS VITAMIN/MINERAL 27-1 MG PO TABS: 1.0000 | ORAL_TABLET | Freq: Every day | ORAL | 2 refills | Status: DC

## 2021-06-16 ENCOUNTER — Inpatient Hospital Stay (HOSPITAL_COMMUNITY)
Admission: AD | Admit: 2021-06-16 | Discharge: 2021-06-17 | Disposition: A | Discharge status: Home / Self Care | Payer: Medicaid Other | Attending: Obstetrics & Gynecology | Admitting: Obstetrics & Gynecology

## 2021-06-16 ENCOUNTER — Other Ambulatory Visit: Payer: Self-pay

## 2021-06-16 DIAGNOSIS — Z3481 Encounter for supervision of other normal pregnancy, first trimester: Secondary | ICD-10-CM

## 2021-06-16 DIAGNOSIS — O219 Vomiting of pregnancy, unspecified: Secondary | ICD-10-CM | POA: Insufficient documentation

## 2021-06-16 DIAGNOSIS — E860 Dehydration: Secondary | ICD-10-CM | POA: Insufficient documentation

## 2021-06-16 DIAGNOSIS — Z87891 Personal history of nicotine dependence: Secondary | ICD-10-CM | POA: Insufficient documentation

## 2021-06-16 DIAGNOSIS — K219 Gastro-esophageal reflux disease without esophagitis: Secondary | ICD-10-CM | POA: Insufficient documentation

## 2021-06-16 DIAGNOSIS — Z3A11 11 weeks gestation of pregnancy: Secondary | ICD-10-CM | POA: Insufficient documentation

## 2021-06-16 DIAGNOSIS — O99281 Endocrine, nutritional and metabolic diseases complicating pregnancy, first trimester: Secondary | ICD-10-CM | POA: Insufficient documentation

## 2021-06-16 DIAGNOSIS — O99611 Diseases of the digestive system complicating pregnancy, first trimester: Secondary | ICD-10-CM | POA: Insufficient documentation

## 2021-06-17 ENCOUNTER — Encounter (HOSPITAL_COMMUNITY): Payer: Self-pay | Admitting: Obstetrics & Gynecology

## 2021-06-17 ENCOUNTER — Other Ambulatory Visit: Payer: Self-pay | Admitting: Family Medicine

## 2021-06-17 DIAGNOSIS — O219 Vomiting of pregnancy, unspecified: Secondary | ICD-10-CM | POA: Diagnosis not present

## 2021-06-17 DIAGNOSIS — O210 Mild hyperemesis gravidarum: Secondary | ICD-10-CM | POA: Diagnosis not present

## 2021-06-17 DIAGNOSIS — E860 Dehydration: Secondary | ICD-10-CM | POA: Diagnosis not present

## 2021-06-17 DIAGNOSIS — Z3A11 11 weeks gestation of pregnancy: Secondary | ICD-10-CM

## 2021-06-17 DIAGNOSIS — K219 Gastro-esophageal reflux disease without esophagitis: Secondary | ICD-10-CM

## 2021-06-17 DIAGNOSIS — Z87891 Personal history of nicotine dependence: Secondary | ICD-10-CM | POA: Diagnosis not present

## 2021-06-17 DIAGNOSIS — O99611 Diseases of the digestive system complicating pregnancy, first trimester: Secondary | ICD-10-CM

## 2021-06-17 DIAGNOSIS — O99281 Endocrine, nutritional and metabolic diseases complicating pregnancy, first trimester: Secondary | ICD-10-CM | POA: Diagnosis not present

## 2021-06-17 LAB — BASIC METABOLIC PANEL
Anion gap: 7 (ref 5–15)
BUN: 7 mg/dL (ref 6–20)
CO2: 24 mmol/L (ref 22–32)
Calcium: 9.1 mg/dL (ref 8.9–10.3)
Chloride: 101 mmol/L (ref 98–111)
Creatinine, Ser: 0.6 mg/dL (ref 0.44–1.00)
GFR, Estimated: >60 mL/min (ref >60)
Glucose, Bld: 95 mg/dL (ref 70–99)
Potassium: 3.7 mmol/L (ref 3.5–5.1)
Sodium: 132 mmol/L — ABNORMAL LOW (ref 135–145)

## 2021-06-17 LAB — URINALYSIS, ROUTINE W REFLEX MICROSCOPIC
Bilirubin Urine: NEGATIVE
Glucose, UA: NEGATIVE mg/dL
Hgb urine dipstick: NEGATIVE
Ketones, ur: NEGATIVE mg/dL
Leukocytes,Ua: NEGATIVE
Nitrite: NEGATIVE
Protein, ur: NEGATIVE mg/dL
Specific Gravity, Urine: 1.029 (ref 1.005–1.030)
pH: 6 (ref 5.0–8.0)

## 2021-06-17 LAB — LIPASE, BLOOD: Lipase: 20 U/L (ref 11–51)

## 2021-06-17 MED ORDER — FAMOTIDINE IN NACL 20-0.9 MG/50ML-% IV SOLN
20.0000 mg | Freq: Once | INTRAVENOUS | Status: AC
  Administered 2021-06-17: 20 mg via INTRAVENOUS
  Filled 2021-06-17: qty 50

## 2021-06-17 MED ORDER — LACTATED RINGERS IV BOLUS
1000.0000 mL | Freq: Once | INTRAVENOUS | Status: AC
  Administered 2021-06-17: 1000 mL via INTRAVENOUS

## 2021-06-17 MED ORDER — METOCLOPRAMIDE HCL 10 MG PO TABS: 10.0000 mg | ORAL_TABLET | Freq: Two times a day (BID) | ORAL | 0 refills | Status: DC | PRN

## 2021-06-17 MED ORDER — METOCLOPRAMIDE HCL 5 MG/ML IJ SOLN
10.0000 mg | Freq: Once | INTRAMUSCULAR | Status: AC
  Administered 2021-06-17: 10 mg via INTRAVENOUS
  Filled 2021-06-17: qty 2

## 2021-06-17 MED ORDER — FAMOTIDINE 20 MG PO TABS: 20.0000 mg | ORAL_TABLET | Freq: Every day | ORAL | 0 refills | Status: DC

## 2021-06-17 NOTE — Discharge Instructions (Addendum)
Safe Medications in Pregnancy   Indigestion: Tums Maalox Zantac  Pepcid  Nausea/Vomiting:  Ginger extract Sea bands Nausea medication to take during pregnancy:  Unisom (doxylamine succinate 25 mg tablets) Take one tablet daily at bedtime. If symptoms are not adequately controlled, the dose can be increased to a maximum recommended dose of two tablets daily (1/2 tablet in the morning, 1/2 tablet mid-afternoon and one at bedtime). Vitamin B6 100mg  tablets. Take one tablet twice a day (up to 200 mg per day). Small frequent meals throughout the day-- trying things in small bites like rice, bananas, applesauce, crackers, etc.  Room temperature fluids usually do better on the stomach in small sips.   I have sent in Reglan and Pepcid for to use. The pepcid can be scheduled daily, this helps with heartburn and reflux (the burning sensation). If you no longer have any issues with that after awhile, you can stop taking it. The reglan is as needed only for nausea that isn't improved with trying the things above.

## 2021-06-17 NOTE — MAU Note (Signed)
Pt reports she has had n/v for several  weeks was taking promethazine but all it did was make her sleepy did not help nausea. N/V worse today having abd pain and "chest" discomfort with vomiting.

## 2021-06-17 NOTE — MAU Provider Note (Signed)
History     CSN: [id number]  Arrival date and time: 06/16/21 2343   None    "Nausea and vomiting"   Briana Curry is a 20 yo female G2P1001 at 46w5dby LMP presenting for evaluation of nausea and vomiting over the past several weeks, however worse in the past 2 days.  Reports she has vomited at least 3-4 times today and yesterday, usually clear or food colored.  Reports emesis after she eats or drinks anything, try to eat Pope[employer] and [employer] today without success.  Threw up just a few sips of water prior to arrival.  Upon arrival to MAU room, she is actively vomiting/dry heaving.  Associated "allover" abdominal cramping just immediately prior to vomiting, otherwise denies abdominal pain.  Denies any fever, dysuria, flank pain, vaginal bleeding, change in bowel movement, or rash.  Has a burning sensation in her throat/chest after vomiting.  She has phenergan at home but doesn't feel like this helps with her nausea much, just makes her sleepy instead.  She has not tried anything else for her symptoms.  No one is sick at home.  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     Past Medical History:  Diagnosis Date   Anemia    Asthma    Broken arm    Iron deficiency    Multiple allergies     Past Surgical History:  Procedure Laterality Date   ARM HARDWARE REMOVAL      Family History  Problem Relation Age of Onset   Asthma Mother    Asthma Sister    Eczema Sister    Urticaria Sister    Asthma Brother    Eczema Brother     Social History   Tobacco Use   Smoking status: Former    Types: Cigars    Quit date: 03/2021    Years since quitting: 0.2   Smokeless tobacco: Never  Vaping Use   Vaping Use: Never used  Substance Use Topics   Alcohol use: Yes   Drug use: No    Allergies:  Allergies  Allergen Reactions   Shrimp [Shellfish Allergy] Anaphylaxis    Pt states  she is also allergic to roaches    Lactose Intolerance (Gi) Diarrhea   Other Rash    Wendy's sweet and sour sauce caused rash and seafood    Medications Prior to Admission  Medication Sig Dispense Refill Last Dose   Prenatal Vit-Fe Fumarate-FA (PRENATAL PLUS VITAMIN/MINERAL) 27-1 MG TABS Take 1 tablet by mouth daily. 90 tablet 2 06/16/2021   promethazine (PHENERGAN) 25 MG tablet Take 1 tablet (25 mg total) by mouth every 6 (six) hours as needed for nausea or vomiting. 30 tablet 2 Past Week   Blood Pressure Monitoring (BLOOD PRESSURE KIT) DEVI 1 kit by Does not apply route once a week. 1 each 0    metroNIDAZOLE (METROGEL VAGINAL) 0.75 % vaginal gel Place 1 Applicatorful vaginally at bedtime. Insert one applicator, at bedtime, for 5 nights. 70 g 0    Misc. Devices (GOJJI WEIGHT SCALE) MISC 1 Device by Does not apply route every 30 (thirty) days. 1 each 0     Review of Systems  Constitutional:  Positive for appetite change and fatigue. Negative for activity change, diaphoresis and fever.  HENT:  Negative for congestion and sore throat.   Respiratory:  Negative for cough, chest  tightness and shortness of breath.   Cardiovascular:  Negative for chest pain.  Gastrointestinal:  Positive for nausea and vomiting. Negative for abdominal pain, constipation and diarrhea.  Genitourinary:  Negative for decreased urine volume, dysuria, flank pain, vaginal bleeding and vaginal discharge.  Musculoskeletal:  Negative for arthralgias and back pain.  Skin:  Negative for rash.  Neurological:  Negative for dizziness and light-headedness.  Psychiatric/Behavioral:  Negative for behavioral problems.   Physical Exam   Blood pressure 120/69, pulse 85, temperature 98 F (36.7 C), resp. rate 18, height 5' 3"  (1.6 m), weight 92.1 kg, last menstrual period 03/27/2021, not currently breastfeeding.  Physical Exam Constitutional:      Comments: Initially actively vomiting when entered the room, after episode in no  acute distress and comfortable laying flat  HENT:     Head: Normocephalic and atraumatic.     Mouth/Throat:     Mouth: Mucous membranes are dry.  Eyes:     Extraocular Movements: Extraocular movements intact.     Conjunctiva/sclera: Conjunctivae normal.  Cardiovascular:     Pulses: Normal pulses.  Pulmonary:     Effort: Pulmonary effort is normal.  Abdominal:     General: There is no distension.     Palpations: Abdomen is soft. There is no mass.     Tenderness: There is no right CVA tenderness or left CVA tenderness.     Comments: Slight tenderness in the epigastrium without rebounding or guarding, no tenderness elsewhere with deep palpation.  Negative Murphy sign and no McBurney point tenderness.  Skin:    General: Skin is warm and dry.     Capillary Refill: Capillary refill takes less than 2 seconds.  Neurological:     General: No focal deficit present.     Mental Status: She is alert.  Psychiatric:        Mood and Affect: Mood normal.        Behavior: Behavior normal.    FHT: 160bpm with doppler   MAU Course    MDM IV LR 1000 ml bolus, IV Pepcid, IV Reglan BMP, lipase to assess electrolytes and R/o pancreatitis with mild epigastric tenderness, however likely related to recent emesis/GERD. Lipase negative. BMP reassuring, mild hyponatremia.  U/a resulted after the above, without s/sx of infection or significant dehydration  Reassessed 0330: Feeling much better and took a nap. No further emesis. Able to tolerate ginger ale without difficulty.    Assessment and Plan   1. Nausea and vomiting during pregnancy prior to [redacted] weeks gestation Minimal clinical dehydration. Reassuring lipase/electrolytes and clear U/A. Suspect related to early pregnancy, less concern for intra-abdominal process with benign abdomen and stable vitals. Rx'd pepcid daily and reglan PRN. Discussed alternative measures including B6, sea bands, ginger, small frequent meals, and adequate hydration.   2. [redacted]  weeks gestation of pregnancy FHT appropriate.   3. Gastroesophageal reflux disease, unspecified whether esophagitis present   Follow up with new ob visit, or to MAU sooner if needed.   Patriciaann Clan 06/17/2021, 2:26 AM

## 2021-06-21 ENCOUNTER — Ambulatory Visit (INDEPENDENT_AMBULATORY_CARE_PROVIDER_SITE_OTHER): Payer: Medicaid Other | Admitting: Obstetrics

## 2021-06-21 ENCOUNTER — Encounter: Payer: Self-pay | Admitting: Obstetrics

## 2021-06-21 ENCOUNTER — Other Ambulatory Visit: Payer: Self-pay

## 2021-06-21 ENCOUNTER — Other Ambulatory Visit (HOSPITAL_COMMUNITY)
Admission: RE | Admit: 2021-06-21 | Discharge: 2021-06-21 | Disposition: A | Discharge status: Home / Self Care | Payer: Medicaid Other | Source: Ambulatory Visit | Attending: Obstetrics | Admitting: Obstetrics

## 2021-06-21 VITALS — BP 124/78 | HR 75 | Wt 206.0 lb

## 2021-06-21 DIAGNOSIS — Z3480 Encounter for supervision of other normal pregnancy, unspecified trimester: Secondary | ICD-10-CM | POA: Diagnosis not present

## 2021-06-21 DIAGNOSIS — O219 Vomiting of pregnancy, unspecified: Secondary | ICD-10-CM

## 2021-06-21 DIAGNOSIS — O99210 Obesity complicating pregnancy, unspecified trimester: Secondary | ICD-10-CM

## 2021-06-21 NOTE — Progress Notes (Signed)
Pt presents for NOB. NOB intake completed 05/29/21 This is not a planned pregnancy, FOB is living together and involved.

## 2021-06-21 NOTE — Progress Notes (Signed)
Subjective:    Briana Curry is being seen today for her first obstetrical visit.  This is not a planned pregnancy. She is at [redacted]w[redacted]d gestation. Her obstetrical history is significant for  none . Relationship with FOB: supportive, living together.  Patient does intend to breast feed. Pregnancy history fully reviewed.  The information documented in the HPI was reviewed and verified.  Menstrual History: OB History     Gravida  2   Para  1   Term  1   Preterm      AB      Living  1      SAB      IAB      Ectopic      Multiple  0   Live Births  1            Patient's last menstrual period was 03/27/2021.    Past Medical History:  Diagnosis Date   Anemia    Asthma    Broken arm    Iron deficiency    Multiple allergies     Past Surgical History:  Procedure Laterality Date   ARM HARDWARE REMOVAL      (Not in a hospital admission)  Allergies  Allergen Reactions   Shrimp [Shellfish Allergy] Anaphylaxis    Pt states she is also allergic to roaches    Lactose Intolerance (Gi) Diarrhea   Other Rash    [employer] sweet and sour sauce caused rash and seafood    Social History   Tobacco Use   Smoking status: Former    Types: Cigars    Quit date: 03/2021    Years since quitting: 0.2   Smokeless tobacco: Never  Substance Use Topics   Alcohol use: Yes    Family History  Problem Relation Age of Onset   Asthma Mother    Asthma Sister    Eczema Sister    Urticaria Sister    Asthma Brother    Eczema Brother      Review of Systems Constitutional: negative for weight loss Gastrointestinal: negative for vomiting Genitourinary:negative for genital lesions and vaginal discharge and dysuria Musculoskeletal:negative for back pain Behavioral/Psych: negative for abusive relationship, depression, illegal drug usage and tobacco use    Objective:    BP 124/78   Pulse 75   Wt 206 lb (93.4 kg)   LMP 03/27/2021   BMI 36.49 kg/m  General Appearance:    Alert,  cooperative, no distress, appears stated age  Head:    Normocephalic, without obvious abnormality, atraumatic  Eyes:    PERRL, conjunctiva/corneas clear, EOM's intact, fundi    benign, both eyes  Ears:    Normal TM's and external ear canals, both ears  Nose:   Nares normal, septum midline, mucosa normal, no drainage    or sinus tenderness  Throat:   Lips, mucosa, and tongue normal; teeth and gums normal  Neck:   Supple, symmetrical, trachea midline, no adenopathy;    thyroid:  no enlargement/tenderness/nodules; no carotid   bruit or JVD  Back:     Symmetric, no curvature, ROM normal, no CVA tenderness  Lungs:     Clear to auscultation bilaterally, respirations unlabored  Chest Wall:    No tenderness or deformity   Heart:    Regular rate and rhythm, S1 and S2 normal, no murmur, rub   or gallop  Breast Exam:    No tenderness, masses, or nipple abnormality  Abdomen:     Soft, non-tender, bowel sounds  active all four quadrants,    no masses, no organomegaly  Genitalia:    Normal female without lesion, discharge or tenderness  Extremities:   Extremities normal, atraumatic, no cyanosis or edema  Pulses:   2+ and symmetric all extremities  Skin:   Skin color, texture, turgor normal, no rashes or lesions  Lymph nodes:   Cervical, supraclavicular, and axillary nodes normal  Neurologic:   CNII-XII intact, normal strength, sensation and reflexes    throughout      Lab Review Urine pregnancy test Labs reviewed yes Radiologic studies reviewed no  Assessment:    Pregnancy at [redacted]w[redacted]d weeks    Plan:    1. Supervision of other normal pregnancy, antepartum Rx: - CBC/D/Plt+RPR+Rh+ABO+RubIgG... - Cervicovaginal ancillary only( [place]) - Genetic Screening - Culture, OB Urine  2. Nausea and vomiting during pregnancy prior to [redacted] weeks gestation - did not tolerate the drowsiness of 25 mg Phenergan.  12.5 mg recommended - Reglan is helping - dietary changes discussed  3. Obesity affecting  pregnancy, antepartum    Prenatal vitamins.  Counseling provided regarding continued use of seat belts, cessation of alcohol consumption, smoking or use of illicit drugs; infection precautions i.e., influenza/TDAP immunizations, toxoplasmosis,CMV, parvovirus, listeria and varicella; workplace safety, exercise during pregnancy; routine dental care, safe medications, sexual activity, hot tubs, saunas, pools, travel, caffeine use, fish and methlymercury, potential toxins, hair treatments, varicose veins Weight gain recommendations per IOM guidelines reviewed: underweight/BMI< 18.5--> gain 28 - 40 lbs; normal weight/BMI 18.5 - 24.9--> gain 25 - 35 lbs; overweight/BMI 25 - 29.9--> gain 15 - 25 lbs; obese/BMI >30->gain  11 - 20 lbs Problem list reviewed and updated. FIRST/CF mutation testing/NIPT/QUAD SCREEN/fragile X/Ashkenazi Jewish population testing/Spinal muscular atrophy discussed: requested. Role of ultrasound in pregnancy discussed; fetal survey: requested. Amniocentesis discussed: not indicated.   Orders Placed This Encounter  Procedures   Culture, OB Urine   CBC/D/Plt+RPR+Rh+ABO+RubIgG...   Genetic Screening    Follow up in 4 weeks.  I have spent a total of 20 minutes of face-to-face and non-face-to-face time, excluding clinical staff time, reviewing notes and preparing to see patient, ordering tests and/or medications, and counseling the patient.   Shelly Bombard, MD 06/21/2021 1:20 PM

## 2021-06-22 ENCOUNTER — Other Ambulatory Visit: Payer: Self-pay | Admitting: Obstetrics

## 2021-06-22 DIAGNOSIS — B9689 Other specified bacterial agents as the cause of diseases classified elsewhere: Secondary | ICD-10-CM

## 2021-06-22 LAB — CERVICOVAGINAL ANCILLARY ONLY
Bacterial Vaginitis (gardnerella): POSITIVE — AB
Candida Glabrata: NEGATIVE
Candida Vaginitis: NEGATIVE
Chlamydia: NEGATIVE
Comment: NEGATIVE
Comment: NEGATIVE
Comment: NEGATIVE
Comment: NEGATIVE
Comment: NEGATIVE
Comment: NORMAL
Neisseria Gonorrhea: NEGATIVE
Trichomonas: NEGATIVE

## 2021-06-22 LAB — CBC/D/PLT+RPR+RH+ABO+RUBIGG...
Antibody Screen: NEGATIVE
Basophils Absolute: 0 10*3/uL (ref 0.0–0.2)
Basos: 0 %
EOS (ABSOLUTE): 0 10*3/uL (ref 0.0–0.4)
Eos: 0 %
HCV Ab: <0.1 s/co ratio (ref 0.0–0.9)
HIV Screen 4th Generation wRfx: NONREACTIVE
Hematocrit: 35 % (ref 34.0–46.6)
Hemoglobin: 11.8 g/dL (ref 11.1–15.9)
Hepatitis B Surface Ag: NEGATIVE
Immature Grans (Abs): 0 10*3/uL (ref 0.0–0.1)
Immature Granulocytes: 0 %
Lymphocytes Absolute: 2.2 10*3/uL (ref 0.7–3.1)
Lymphs: 30 %
MCH: 30.2 pg (ref 26.6–33.0)
MCHC: 33.7 g/dL (ref 31.5–35.7)
MCV: 90 fL (ref 79–97)
Monocytes Absolute: 0.6 10*3/uL (ref 0.1–0.9)
Monocytes: 8 %
Neutrophils Absolute: 4.7 10*3/uL (ref 1.4–7.0)
Neutrophils: 62 %
Platelets: 258 10*3/uL (ref 150–450)
RBC: 3.91 x10E6/uL (ref 3.77–5.28)
RDW: 12.6 % (ref 11.7–15.4)
RPR Ser Ql: NONREACTIVE
Rh Factor: POSITIVE
Rubella Antibodies, IGG: 6.06 index (ref >0.99)
WBC: 7.5 10*3/uL (ref 3.4–10.8)

## 2021-06-22 LAB — HCV INTERPRETATION

## 2021-06-22 MED ORDER — METRONIDAZOLE 0.75 % VA GEL: 1.0000 | Freq: Every day | VAGINAL | 0 refills | Status: DC

## 2021-06-25 LAB — CULTURE, OB URINE

## 2021-06-25 LAB — URINE CULTURE, OB REFLEX

## 2021-06-27 ENCOUNTER — Other Ambulatory Visit: Payer: Self-pay | Admitting: Obstetrics

## 2021-06-27 DIAGNOSIS — O2342 Unspecified infection of urinary tract in pregnancy, second trimester: Secondary | ICD-10-CM

## 2021-06-27 MED ORDER — CEFUROXIME AXETIL 500 MG PO TABS: 500.0000 mg | ORAL_TABLET | Freq: Two times a day (BID) | ORAL | 0 refills | Status: DC

## 2021-06-29 ENCOUNTER — Inpatient Hospital Stay (HOSPITAL_COMMUNITY)
Admission: AD | Admit: 2021-06-29 | Discharge: 2021-06-29 | Disposition: A | Discharge status: Home / Self Care | Payer: Medicaid Other | Attending: Obstetrics & Gynecology | Admitting: Obstetrics & Gynecology

## 2021-06-29 DIAGNOSIS — Z87891 Personal history of nicotine dependence: Secondary | ICD-10-CM | POA: Diagnosis not present

## 2021-06-29 DIAGNOSIS — H9209 Otalgia, unspecified ear: Secondary | ICD-10-CM

## 2021-06-29 DIAGNOSIS — R051 Acute cough: Secondary | ICD-10-CM

## 2021-06-29 DIAGNOSIS — O99891 Other specified diseases and conditions complicating pregnancy: Secondary | ICD-10-CM | POA: Diagnosis not present

## 2021-06-29 DIAGNOSIS — J029 Acute pharyngitis, unspecified: Secondary | ICD-10-CM | POA: Diagnosis not present

## 2021-06-29 DIAGNOSIS — O26891 Other specified pregnancy related conditions, first trimester: Secondary | ICD-10-CM | POA: Diagnosis not present

## 2021-06-29 DIAGNOSIS — H9201 Otalgia, right ear: Secondary | ICD-10-CM | POA: Diagnosis not present

## 2021-06-29 DIAGNOSIS — Z3A13 13 weeks gestation of pregnancy: Secondary | ICD-10-CM | POA: Diagnosis not present

## 2021-06-29 DIAGNOSIS — Z20822 Contact with and (suspected) exposure to covid-19: Secondary | ICD-10-CM | POA: Diagnosis not present

## 2021-06-29 DIAGNOSIS — R059 Cough, unspecified: Secondary | ICD-10-CM | POA: Diagnosis not present

## 2021-06-29 LAB — RESP PANEL BY RT-PCR (FLU A&B, COVID) ARPGX2
Influenza A by PCR: NEGATIVE
Influenza B by PCR: NEGATIVE
SARS Coronavirus 2 by RT PCR: NEGATIVE

## 2021-06-29 MED ORDER — BENZONATATE 100 MG PO CAPS: 200.0000 mg | ORAL_CAPSULE | Freq: Three times a day (TID) | ORAL | 0 refills | Status: DC | PRN

## 2021-06-29 NOTE — MAU Note (Signed)
..  Briana Curry is a 20 y.o. at [redacted]w[redacted]d here in MAU reporting: sore throat and cough that have been going on for three days. Abdominal that began two weeks ago, she was seen for it and was diagnosed with a UTI but has not been to pick up prescription. Denies vaginal bleeding.  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

## 2021-06-29 NOTE — Discharge Instructions (Signed)

## 2021-06-29 NOTE — MAU Provider Note (Signed)
Chief Complaint: Sore Throat and Cough   Event Date/Time   First Provider Initiated Contact with Patient 06/29/21 2302      SUBJECTIVE HPI: Briana Curry is a 20 y.o. G2P1001 at 33w3dby LMP who presents to maternity admissions reporting onset of sore throat, right ear pain, and cough 3 days ago. She has not tried any treatments.  There are no other symptoms.  She denies shortness of breath.   HPI  Past Medical History:  Diagnosis Date   Anemia    Asthma    Broken arm    Iron deficiency    Multiple allergies    Past Surgical History:  Procedure Laterality Date   ARM HARDWARE REMOVAL     Social History   Socioeconomic History   Marital status: Single    Spouse name: Not on file   Number of children: Not on file   Years of education: Not on file   Highest education level: Not on file  Occupational History   Not on file  Tobacco Use   Smoking status: Former    Types: Cigars    Quit date: 03/2021    Years since quitting: 0.2   Smokeless tobacco: Never  Vaping Use   Vaping Use: Never used  Substance and Sexual Activity   Alcohol use: Yes   Drug use: No   Sexual activity: Yes    Partners: Male    Birth control/protection: None  Other Topics Concern   Not on file  Social History Narrative   ** Merged History Encounter **       Social Determinants of Health   Financial Resource Strain: Not on file  Food Insecurity: Not on file  Transportation Needs: Not on file  Physical Activity: Not on file  Stress: Not on file  Social Connections: Not on file  Intimate Partner Violence: Not on file   No current facility-administered medications on file prior to encounter.   Current Outpatient Medications on File Prior to Encounter  Medication Sig Dispense Refill   Blood Pressure Monitoring (BLOOD PRESSURE KIT) DEVI 1 kit by Does not apply route once a week. 1 each 0   cefUROXime (CEFTIN) 500 MG tablet Take 1 tablet (500 mg total) by mouth 2 (two) times daily with a  meal. 14 tablet 0   famotidine (PEPCID) 20 MG tablet Take 1 tablet (20 mg total) by mouth daily. 30 tablet 0   metoCLOPramide (REGLAN) 10 MG tablet Take 1 tablet (10 mg total) by mouth 2 (two) times daily as needed for nausea. 15 tablet 0   metroNIDAZOLE (METROGEL VAGINAL) 0.75 % vaginal gel Place 1 Applicatorful vaginally at bedtime. Insert one applicator, at bedtime, for 5 nights. 70 g 0   Misc. Devices (GOJJI WEIGHT SCALE) MISC 1 Device by Does not apply route every 30 (thirty) days. 1 each 0   Prenatal Vit-Fe Fumarate-FA (PRENATAL PLUS VITAMIN/MINERAL) 27-1 MG TABS Take 1 tablet by mouth daily. 90 tablet 2   promethazine (PHENERGAN) 25 MG tablet Take 1 tablet (25 mg total) by mouth every 6 (six) hours as needed for nausea or vomiting. 30 tablet 2   Allergies  Allergen Reactions   Shrimp [Shellfish Allergy] Anaphylaxis    Pt states she is also allergic to roaches    Lactose Intolerance (Gi) Diarrhea   Other Rash    [employer] sweet and sour sauce caused rash and seafood    ROS:  Review of Systems  Constitutional:  Negative for chills and fever.  HENT:  Positive for ear pain and sore throat. Negative for congestion.   Respiratory:  Positive for cough.   Gastrointestinal:  Negative for nausea and vomiting.    I have reviewed patient's Past Medical Hx, Surgical Hx, Family Hx, Social Hx, medications and allergies.   Physical Exam  Patient Vitals for the past 24 hrs:  BP Temp Temp src Pulse Resp SpO2 Height Weight  06/29/21 2249 113/69 98.3 F (36.8 C) Oral 94 17 100 % 5' 3"  (1.6 m) 93.8 kg   Constitutional: Well-developed, well-nourished female in no acute distress.  HEENT: right ear with mild erythema, no edema or exudate, tympanic membrane wnl HEART: normal rate, heart sounds, regular rhythm RESP: normal effort, lung sounds clear and equal bilaterally  GI: Abd soft, non-tender. Pos BS x 4 MS: Extremities nontender, no edema, normal ROM Neurologic: Alert and oriented x 4.  GU:  Neg CVAT.  PELVIC EXAM: Deferred  FHT 160 by doppler  LAB RESULTS Results for orders placed or performed during the hospital encounter of 06/29/21 (from the past 24 hour(s))  Resp Panel by RT-PCR (Flu A&B, Covid) Nasopharyngeal Swab     Status: None   Collection Time: 06/29/21 10:55 PM   Specimen: Nasopharyngeal Swab; Nasopharyngeal(NP) swabs in vial transport medium  Result Value Ref Range   SARS Coronavirus 2 by RT PCR NEGATIVE NEGATIVE   Influenza A by PCR NEGATIVE NEGATIVE   Influenza B by PCR NEGATIVE NEGATIVE  Group A Strep by PCR     Status: None   Collection Time: 06/29/21 11:19 PM   Specimen: Throat; Sterile Swab  Result Value Ref Range   Group A Strep by PCR NOT DETECTED NOT DETECTED    A/Positive/-- (11/03 1153)  IMAGING No results found.  MAU Management/MDM: Orders Placed This Encounter  Procedures   Resp Panel by RT-PCR (Flu A&B, Covid) Nasopharyngeal Swab   Group A Strep by PCR   Discharge patient    Meds ordered this encounter  Medications   benzonatate (TESSALON) 100 MG capsule    Sig: Take 2 capsules (200 mg total) by mouth 3 (three) times daily as needed for cough.    Dispense:  21 capsule    Refill:  0    Order Specific Question:   Supervising Provider    Answer:   Verita Schneiders A [[id number]]    With normal vital signs and normal FHR, and no acute or emergent findings, pt evaluated in triage area by CNM.  Lung sounds clear and equal bilaterally, O2 100% on RA.  Swabs collected for strep, COVID and flu.  Pt discharged prior to results, which wer all negative.  Rx for [employer] and list of safe OTC medications given.  Rest, increase PO fluids, gargle with saltwater for sore throat.  Note for pt to miss work x 1 day.  Return with worsening symptoms.    ASSESSMENT 1. Acute cough   2. Sore throat   3. Ear ache   4. [redacted] weeks gestation of pregnancy     PLAN Discharge home Allergies as of 06/29/2021       Reactions   Shrimp [shellfish Allergy]  Anaphylaxis   Pt states she is also allergic to roaches    Lactose Intolerance (gi) Diarrhea   Other Rash   [employer] sweet and sour sauce caused rash and seafood        Medication List     TAKE these medications    benzonatate 100 MG capsule Commonly known as: TESSALON  Take 2 capsules (200 mg total) by mouth 3 (three) times daily as needed for cough.   Blood Pressure Kit Devi 1 kit by Does not apply route once a week.   cefUROXime 500 MG tablet Commonly known as: CEFTIN Take 1 tablet (500 mg total) by mouth 2 (two) times daily with a meal.   famotidine 20 MG tablet Commonly known as: Pepcid Take 1 tablet (20 mg total) by mouth daily.   Gojji Weight Scale Misc 1 Device by Does not apply route every 30 (thirty) days.   metoCLOPramide 10 MG tablet Commonly known as: Reglan Take 1 tablet (10 mg total) by mouth 2 (two) times daily as needed for nausea.   metroNIDAZOLE 0.75 % vaginal gel Commonly known as: METROGEL VAGINAL Place 1 Applicatorful vaginally at bedtime. Insert one applicator, at bedtime, for 5 nights.   Prenatal Plus Vitamin/Mineral 27-1 MG Tabs Take 1 tablet by mouth daily.   promethazine 25 MG tablet Commonly known as: PHENERGAN Take 1 tablet (25 mg total) by mouth every 6 (six) hours as needed for nausea or vomiting.        Follow-up Information     [facility] Follow up.   Why: As scheduled Contact information: [place] [id number] [facility] Follow up.   Specialty: Obstetrics and Gynecology Why: As needed for emergencies, If symptoms worsen Contact information: [address] [phone] [place] Certified Nurse-Midwife 06/30/2021  1:39 AM

## 2021-06-30 LAB — GROUP A STREP BY PCR: Group A Strep by PCR: NOT DETECTED

## 2021-07-03 ENCOUNTER — Encounter: Payer: Self-pay | Admitting: Obstetrics

## 2021-07-16 ENCOUNTER — Other Ambulatory Visit: Payer: Self-pay | Admitting: Family Medicine

## 2021-07-19 ENCOUNTER — Other Ambulatory Visit (HOSPITAL_COMMUNITY)
Admission: RE | Admit: 2021-07-19 | Discharge: 2021-07-19 | Disposition: A | Discharge status: Home / Self Care | Payer: Medicaid Other | Source: Ambulatory Visit

## 2021-07-19 ENCOUNTER — Encounter: Payer: Self-pay | Admitting: Obstetrics

## 2021-07-19 ENCOUNTER — Other Ambulatory Visit: Payer: Self-pay

## 2021-07-19 ENCOUNTER — Ambulatory Visit (INDEPENDENT_AMBULATORY_CARE_PROVIDER_SITE_OTHER): Payer: Medicaid Other | Admitting: Obstetrics

## 2021-07-19 VITALS — BP 103/70 | HR 82 | Wt 206.0 lb

## 2021-07-19 DIAGNOSIS — N898 Other specified noninflammatory disorders of vagina: Secondary | ICD-10-CM

## 2021-07-19 DIAGNOSIS — O99210 Obesity complicating pregnancy, unspecified trimester: Secondary | ICD-10-CM

## 2021-07-19 DIAGNOSIS — R079 Chest pain, unspecified: Secondary | ICD-10-CM

## 2021-07-19 DIAGNOSIS — O2342 Unspecified infection of urinary tract in pregnancy, second trimester: Secondary | ICD-10-CM

## 2021-07-19 DIAGNOSIS — Z3480 Encounter for supervision of other normal pregnancy, unspecified trimester: Secondary | ICD-10-CM

## 2021-07-19 MED ORDER — CONCEPT OB 130-92.4-1 MG PO CAPS: 1.0000 | ORAL_CAPSULE | Freq: Every day | ORAL | 11 refills | Status: DC

## 2021-07-19 MED ORDER — AMOXICILLIN 400 MG/5ML PO SUSR: 400.0000 mg | Freq: Three times a day (TID) | ORAL | 0 refills | Status: DC

## 2021-07-19 NOTE — Progress Notes (Signed)
Subjective:  Briana Curry is a 20 y.o. G2P1001 at [redacted]w[redacted]d being seen today for ongoing prenatal care.  She is currently monitored for the following issues for this low-risk pregnancy and has BMI 40.0-44.9, adult ([place]); Rash and other nonspecific skin eruption; Urticaria; Lactose intolerance; Adverse food reaction; Other allergic rhinitis; Obesity (BMI 30-39.9); and Encounter for supervision of normal pregnancy, antepartum on their problem list.  Patient reports  chest pain .  Denies SOB, nausea or dizziness.  Contractions: Not present. Vag. Bleeding: None.  Movement: Present. Denies leaking of fluid.   The following portions of the patient's history were reviewed and updated as appropriate: allergies, current medications, past family history, past medical history, past social history, past surgical history and problem list. Problem list updated.  Objective:   Vitals:   07/19/21 1031  BP: 103/70  Pulse: 82  Weight: 206 lb (93.4 kg)    Fetal Status: Fetal Heart Rate (bpm): 156   Movement: Present     General:  Alert, oriented and cooperative. Patient is in no acute distress.  Skin: Skin is warm and dry. No rash noted.   Cardiovascular: Normal heart rate noted  Respiratory: Normal respiratory effort, no problems with respiration noted  Abdomen: Soft, gravid, appropriate for gestational age. Pain/Pressure: Absent     Pelvic:  Cervical exam deferred        Extremities: Normal range of motion.  Edema: None  Mental Status: Normal mood and affect. Normal behavior. Normal judgment and thought content.   Urinalysis:      Assessment and Plan:  Pregnancy: G2P1001 at [redacted]w[redacted]d  1. Supervision of other normal pregnancy, antepartum Rx: - Prenat w/o A Vit-FeFum-FePo-FA (CONCEPT OB) 130-92.4-1 MG CAPS; Take 1 capsule by mouth daily.  Dispense: 30 capsule; Refill: 11 - [place] MFM OB COMP + 14 WK; Future  2. Urinary tract infection in mother during second trimester of pregnancy Rx: - amoxicillin  (AMOXIL) 400 MG/5ML suspension; Take 5 mLs (400 mg total) by mouth 3 (three) times daily.  Dispense: 100 mL; Refill: 0  3. Obesity affecting pregnancy, antepartum  4. Vaginal discharge Rx: - Cervicovaginal ancillary only( [place])  5. Chest pain in adult - patient sent [place]    Preterm labor symptoms and general obstetric precautions including but not limited to vaginal bleeding, contractions, leaking of fluid and fetal movement were reviewed in detail with the patient. Please refer to After Visit Summary for other counseling recommendations.   Return in about 4 weeks (around 08/16/2021) for ROB.   Shelly Bombard, MD  07/19/21

## 2021-07-19 NOTE — Progress Notes (Signed)
Pt presents for ROB and c/o L chest pain x 2 days.  Pt is having difficulty keeping down Ceftin tabs.  She is requesting PNV gummies.  Pt reports malodorous vaginal discharge and requests vaginal STD testing.

## 2021-07-20 ENCOUNTER — Other Ambulatory Visit: Payer: Self-pay | Admitting: Obstetrics

## 2021-07-20 DIAGNOSIS — B3731 Acute candidiasis of vulva and vagina: Secondary | ICD-10-CM

## 2021-07-20 LAB — CERVICOVAGINAL ANCILLARY ONLY
Bacterial Vaginitis (gardnerella): NEGATIVE
Candida Glabrata: NEGATIVE
Candida Vaginitis: POSITIVE — AB
Chlamydia: NEGATIVE
Comment: NEGATIVE
Comment: NEGATIVE
Comment: NEGATIVE
Comment: NEGATIVE
Comment: NEGATIVE
Comment: NORMAL
Neisseria Gonorrhea: NEGATIVE
Trichomonas: NEGATIVE

## 2021-07-20 MED ORDER — TERCONAZOLE 0.4 % VA CREA
1.0000 | TOPICAL_CREAM | Freq: Every day | VAGINAL | 0 refills | Status: DC
Start: 2021-07-20 — End: 2021-07-22

## 2021-07-22 ENCOUNTER — Inpatient Hospital Stay (HOSPITAL_COMMUNITY): Payer: Medicaid Other

## 2021-07-22 ENCOUNTER — Other Ambulatory Visit: Payer: Self-pay

## 2021-07-22 ENCOUNTER — Encounter (HOSPITAL_COMMUNITY): Payer: Self-pay | Admitting: Obstetrics and Gynecology

## 2021-07-22 ENCOUNTER — Inpatient Hospital Stay (HOSPITAL_COMMUNITY)
Admission: AD | Admit: 2021-07-22 | Discharge: 2021-07-22 | Disposition: A | Discharge status: Home / Self Care | Payer: Medicaid Other | Attending: Obstetrics and Gynecology | Admitting: Obstetrics and Gynecology

## 2021-07-22 DIAGNOSIS — R8271 Bacteriuria: Secondary | ICD-10-CM | POA: Diagnosis present

## 2021-07-22 DIAGNOSIS — R059 Cough, unspecified: Secondary | ICD-10-CM | POA: Diagnosis not present

## 2021-07-22 DIAGNOSIS — J029 Acute pharyngitis, unspecified: Secondary | ICD-10-CM | POA: Diagnosis not present

## 2021-07-22 DIAGNOSIS — O219 Vomiting of pregnancy, unspecified: Secondary | ICD-10-CM | POA: Diagnosis not present

## 2021-07-22 DIAGNOSIS — O99512 Diseases of the respiratory system complicating pregnancy, second trimester: Secondary | ICD-10-CM | POA: Diagnosis not present

## 2021-07-22 DIAGNOSIS — R519 Headache, unspecified: Secondary | ICD-10-CM | POA: Diagnosis not present

## 2021-07-22 DIAGNOSIS — Z3A16 16 weeks gestation of pregnancy: Secondary | ICD-10-CM | POA: Diagnosis not present

## 2021-07-22 DIAGNOSIS — U071 COVID-19: Secondary | ICD-10-CM | POA: Diagnosis not present

## 2021-07-22 DIAGNOSIS — H938X3 Other specified disorders of ear, bilateral: Secondary | ICD-10-CM

## 2021-07-22 DIAGNOSIS — H838X3 Other specified diseases of inner ear, bilateral: Secondary | ICD-10-CM | POA: Diagnosis not present

## 2021-07-22 DIAGNOSIS — R051 Acute cough: Secondary | ICD-10-CM

## 2021-07-22 DIAGNOSIS — O98512 Other viral diseases complicating pregnancy, second trimester: Secondary | ICD-10-CM | POA: Insufficient documentation

## 2021-07-22 DIAGNOSIS — R0981 Nasal congestion: Secondary | ICD-10-CM | POA: Insufficient documentation

## 2021-07-22 DIAGNOSIS — O26892 Other specified pregnancy related conditions, second trimester: Secondary | ICD-10-CM | POA: Insufficient documentation

## 2021-07-22 LAB — CBC WITH DIFFERENTIAL/PLATELET
Abs Immature Granulocytes: 0.01 10*3/uL (ref 0.00–0.07)
Basophils Absolute: 0 10*3/uL (ref 0.0–0.1)
Basophils Relative: 0 %
Eosinophils Absolute: 0.1 10*3/uL (ref 0.0–0.5)
Eosinophils Relative: 1 %
HCT: 32.9 % — ABNORMAL LOW (ref 36.0–46.0)
Hemoglobin: 11.6 g/dL — ABNORMAL LOW (ref 12.0–15.0)
Immature Granulocytes: 0 %
Lymphocytes Relative: 27 %
Lymphs Abs: 1.6 10*3/uL (ref 0.7–4.0)
MCH: 31.8 pg (ref 26.0–34.0)
MCHC: 35.3 g/dL (ref 30.0–36.0)
MCV: 90.1 fL (ref 80.0–100.0)
Monocytes Absolute: 0.8 10*3/uL (ref 0.1–1.0)
Monocytes Relative: 14 %
Neutro Abs: 3.4 10*3/uL (ref 1.7–7.7)
Neutrophils Relative %: 58 %
Platelets: 220 10*3/uL (ref 150–400)
RBC: 3.65 MIL/uL — ABNORMAL LOW (ref 3.87–5.11)
RDW: 12.6 % (ref 11.5–15.5)
WBC: 5.9 10*3/uL (ref 4.0–10.5)
nRBC: 0 % (ref 0.0–0.2)

## 2021-07-22 LAB — URINALYSIS, ROUTINE W REFLEX MICROSCOPIC
Bilirubin Urine: NEGATIVE
Glucose, UA: NEGATIVE mg/dL
Hgb urine dipstick: NEGATIVE
Ketones, ur: NEGATIVE mg/dL
Leukocytes,Ua: NEGATIVE
Nitrite: NEGATIVE
Protein, ur: NEGATIVE mg/dL
Specific Gravity, Urine: 1.019 (ref 1.005–1.030)
pH: 6 (ref 5.0–8.0)

## 2021-07-22 LAB — COMPREHENSIVE METABOLIC PANEL
ALT: 13 U/L (ref 0–44)
AST: 17 U/L (ref 15–41)
Albumin: 3 g/dL — ABNORMAL LOW (ref 3.5–5.0)
Alkaline Phosphatase: 56 U/L (ref 38–126)
Anion gap: 9 (ref 5–15)
BUN: 6 mg/dL (ref 6–20)
CO2: 22 mmol/L (ref 22–32)
Calcium: 8.7 mg/dL — ABNORMAL LOW (ref 8.9–10.3)
Chloride: 104 mmol/L (ref 98–111)
Creatinine, Ser: 0.56 mg/dL (ref 0.44–1.00)
GFR, Estimated: >60 mL/min (ref >60)
Glucose, Bld: 77 mg/dL (ref 70–99)
Potassium: 3.7 mmol/L (ref 3.5–5.1)
Sodium: 135 mmol/L (ref 135–145)
Total Bilirubin: 0.4 mg/dL (ref 0.3–1.2)
Total Protein: 6.7 g/dL (ref 6.5–8.1)

## 2021-07-22 LAB — RESP PANEL BY RT-PCR (FLU A&B, COVID) ARPGX2
Influenza A by PCR: NEGATIVE
Influenza B by PCR: NEGATIVE
SARS Coronavirus 2 by RT PCR: POSITIVE — AB

## 2021-07-22 LAB — TROPONIN I (HIGH SENSITIVITY): Troponin I (High Sensitivity): <2 ng/L (ref <18)

## 2021-07-22 MED ORDER — BENZONATATE 100 MG PO CAPS: 200.0000 mg | ORAL_CAPSULE | Freq: Three times a day (TID) | ORAL | 0 refills | Status: DC | PRN

## 2021-07-22 MED ORDER — GUAIFENESIN 100 MG/5ML PO LIQD
5.0000 mL | ORAL | Status: DC | PRN
  Administered 2021-07-22: 17:00:00 5 mL via ORAL
  Filled 2021-07-22: qty 15

## 2021-07-22 MED ORDER — PROMETHAZINE-DM 6.25-15 MG/5ML PO SYRP: 10.0000 mL | ORAL_SOLUTION | Freq: Four times a day (QID) | ORAL | 0 refills | Status: DC | PRN

## 2021-07-22 MED ORDER — IBUPROFEN 600 MG PO TABS
600.0000 mg | ORAL_TABLET | Freq: Once | ORAL | Status: AC
  Administered 2021-07-22: 17:00:00 600 mg via ORAL
  Filled 2021-07-22: qty 1

## 2021-07-22 NOTE — MAU Provider Note (Signed)
History     CSN: [id number]  Arrival date and time: 07/22/21 1520   None     Chief Complaint  Patient presents with   Headache   Sore Throat   Emesis   Chest Pain   HPI  [name] is a 20 y.o. female G2P1001 @ 38w5dhere in MAU with sore throat, nasal congestion, cough, and N/V. The symptoms started yesterday. She reports vomiting 2x yesterday. 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  No fever.  She has not taken anything for the symptoms OTC. She reports bilateral ear pain. The pain comes and goes. This is a new problem. She has tried tylenol only.   OB History     Gravida  2   Para  1   Term  1   Preterm      AB      Living  1      SAB      IAB      Ectopic      Multiple  0   Live Births  1           Past Medical History:  Diagnosis Date   Anemia    Asthma    Broken arm    Iron deficiency    Multiple allergies     Past Surgical History:  Procedure Laterality Date   ARM HARDWARE REMOVAL      Family History  Problem Relation Age of Onset   Asthma Mother    Asthma Sister    Eczema Sister    Urticaria Sister    Asthma Brother    Eczema Brother     Social History   Tobacco Use   Smoking status: Former    Types: Cigars    Quit date: 03/2021    Years since quitting: 0.3   Smokeless tobacco: Never  Vaping Use   Vaping Use: Never used  Substance Use Topics   Alcohol use: Yes   Drug use: No    Allergies:  Allergies  Allergen Reactions   Shrimp [Shellfish Allergy] Anaphylaxis    Pt states she is also allergic to roaches    Lactose Intolerance (Gi) Diarrhea   Other Rash    [employer] sweet and sour sauce caused rash and seafood    Medications Prior to Admission  Medication Sig Dispense Refill Last Dose   amoxicillin (AMOXIL) 400 MG/5ML suspension Take 5 mLs (400 mg total) by mouth 3 (three) times daily. 100 mL 0    benzonatate (TESSALON) 100 MG capsule Take 2  capsules (200 mg total) by mouth 3 (three) times daily as needed for cough. (Patient not taking: Reported on 07/19/2021) 21 capsule 0    Blood Pressure Monitoring (BLOOD PRESSURE KIT) DEVI 1 kit by Does not apply route once a week. 1 each 0    famotidine (PEPCID) 20 MG tablet TAKE 1 TABLET BY MOUTH EVERY DAY (Patient not taking: Reported on 07/19/2021) 30 tablet 0    metoCLOPramide (REGLAN) 10 MG tablet Take 1 tablet (10 mg total) by mouth 2 (two) times daily as needed for nausea. (Patient not taking: Reported on 07/19/2021) 15 tablet 0    metroNIDAZOLE (METROGEL VAGINAL) 0.75 % vaginal gel Place 1 Applicatorful vaginally at bedtime. Insert one applicator, at bedtime, for 5 nights. (Patient not taking: Reported on 07/19/2021) 70 g 0    Misc. Devices (GOJJI WEIGHT SCALE) MISC 1 Device by Does not apply  route every 30 (thirty) days. 1 each 0    Prenat w/o A Vit-FeFum-FePo-FA (CONCEPT OB) 130-92.4-1 MG CAPS Take 1 capsule by mouth daily. 30 capsule 11    promethazine (PHENERGAN) 25 MG tablet Take 1 tablet (25 mg total) by mouth every 6 (six) hours as needed for nausea or vomiting. 30 tablet 2    terconazole (TERAZOL 7) 0.4 % vaginal cream Place 1 applicator vaginally at bedtime. 45 g 0    Results for orders placed or performed during the hospital encounter of 07/22/21 (from the past 48 hour(s))  Urinalysis, Routine w reflex microscopic Urine, Clean Catch     Status: None   Collection Time: 07/22/21  3:34 PM  Result Value Ref Range   Color, Urine YELLOW YELLOW   APPearance CLEAR CLEAR   Specific Gravity, Urine 1.019 1.005 - 1.030   pH 6.0 5.0 - 8.0   Glucose, UA NEGATIVE NEGATIVE mg/dL   Hgb urine dipstick NEGATIVE NEGATIVE   Bilirubin Urine NEGATIVE NEGATIVE   Ketones, ur NEGATIVE NEGATIVE mg/dL   Protein, ur NEGATIVE NEGATIVE mg/dL   Nitrite NEGATIVE NEGATIVE   Leukocytes,Ua NEGATIVE NEGATIVE    Comment: Performed at [facility] [address] [place], [place] [address]  Resp Panel by  RT-PCR (Flu A&B, Covid) Nasopharyngeal Swab     Status: Abnormal   Collection Time: 07/22/21  3:52 PM   Specimen: Nasopharyngeal Swab; Nasopharyngeal(NP) swabs in vial transport medium  Result Value Ref Range   SARS Coronavirus 2 by RT PCR POSITIVE (A) NEGATIVE    Comment: RESULT CALLED TO, READ BACK BY AND VERIFIED WITH: E. EDWARDS RN, AT 1448 07/22/21 D. VANHOOK (NOTE) SARS-CoV-2 target nucleic acids are DETECTED.  The SARS-CoV-2 RNA is generally detectable in upper respiratory specimens during the acute phase of infection. Positive results are indicative of the presence of the identified virus, but do not rule out bacterial infection or co-infection with other pathogens not detected by the test. Clinical correlation with patient history and other diagnostic information is necessary to determine patient infection status. The expected result is Negative.  Fact Sheet for Patients: EntrepreneurPulse.com.au  Fact Sheet for Healthcare Providers: IncredibleEmployment.be  This test is not yet approved or cleared by the [place] FDA and  has been authorized for detection and/or diagnosis of SARS-CoV-2 by FDA under an Emergency Use Authorization (EUA).  This EUA will remain in effect (meaning this tes t can be used) for the duration of  the COVID-19 declaration under Section 564(b)(1) of the Act, 21 U.S.C. section 360bbb-3(b)(1), unless the authorization is terminated or revoked sooner.     Influenza A by PCR NEGATIVE NEGATIVE   Influenza B by PCR NEGATIVE NEGATIVE    Comment: (NOTE) The Xpert Xpress SARS-CoV-2/FLU/RSV plus assay is intended as an aid in the diagnosis of influenza from Nasopharyngeal swab specimens and should not be used as a sole basis for treatment. Nasal washings and aspirates are unacceptable for Xpert Xpress SARS-CoV-2/FLU/RSV testing.  Fact Sheet for Patients: EntrepreneurPulse.com.au  Fact Sheet for  Healthcare Providers: IncredibleEmployment.be  This test is not yet approved or cleared by the [place] FDA and has been authorized for detection and/or diagnosis of SARS-CoV-2 by FDA under an Emergency Use Authorization (EUA). This EUA will remain in effect (meaning this test can be used) for the duration of the COVID-19 declaration under Section 564(b)(1) of the Act, 21 U.S.C. section 360bbb-3(b)(1), unless the authorization is terminated or revoked.  Performed at [facility], [address] [place], [place] [address]   CBC with Differential/Platelet     Status: Abnormal   Collection Time: 07/22/21  3:58 PM  Result Value Ref Range   WBC 5.9 4.0 - 10.5 K/uL   RBC 3.65 (L) 3.87 - 5.11 MIL/uL   Hemoglobin 11.6 (L) 12.0 - 15.0 g/dL   HCT 32.9 (L) 36.0 - 46.0 %   MCV 90.1 80.0 - 100.0 fL   MCH 31.8 26.0 - 34.0 pg   MCHC 35.3 30.0 - 36.0 g/dL   RDW 12.6 11.5 - 15.5 %   Platelets 220 150 - 400 K/uL   nRBC 0.0 0.0 - 0.2 %   Neutrophils Relative % 58 %   Neutro Abs 3.4 1.7 - 7.7 K/uL   Lymphocytes Relative 27 %   Lymphs Abs 1.6 0.7 - 4.0 K/uL   Monocytes Relative 14 %   Monocytes Absolute 0.8 0.1 - 1.0 K/uL   Eosinophils Relative 1 %   Eosinophils Absolute 0.1 0.0 - 0.5 K/uL   Basophils Relative 0 %   Basophils Absolute 0.0 0.0 - 0.1 K/uL   Immature Granulocytes 0 %   Abs Immature Granulocytes 0.01 0.00 - 0.07 K/uL    Comment: Performed at [facility], [address], [place], [place] [address]  Comprehensive metabolic panel     Status: Abnormal   Collection Time: 07/22/21  3:58 PM  Result Value Ref Range   Sodium 135 135 - 145 mmol/L   Potassium 3.7 3.5 - 5.1 mmol/L   Chloride 104 98 - 111 mmol/L   CO2 22 22 - 32 mmol/L   Glucose, Bld 77 70 - 99 mg/dL    Comment: Glucose reference range applies only to samples taken after fasting for at least 8 hours.   BUN 6 6 - 20 mg/dL   Creatinine, Ser 0.56 0.44 - 1.00 mg/dL   Calcium 8.7 (L) 8.9  - 10.3 mg/dL   Total Protein 6.7 6.5 - 8.1 g/dL   Albumin 3.0 (L) 3.5 - 5.0 g/dL   AST 17 15 - 41 U/L   ALT 13 0 - 44 U/L   Alkaline Phosphatase 56 38 - 126 U/L   Total Bilirubin 0.4 0.3 - 1.2 mg/dL   GFR, Estimated >60 >60 mL/min    Comment: (NOTE) Calculated using the CKD-EPI Creatinine Equation (2021)    Anion gap 9 5 - 15    Comment: Performed at [facility], [place] [address], [place], [place] [address]  Troponin I (High Sensitivity)     Status: None   Collection Time: 07/22/21  3:58 PM  Result Value Ref Range   Troponin I (High Sensitivity) <2 <18 ng/L    Comment: (NOTE) Elevated high sensitivity troponin I (hsTnI) values and significant  changes across serial measurements may suggest ACS but many other  chronic and acute conditions are known to elevate hsTnI results.  Refer to the "Links" section for chest pain algorithms and additional  guidance. Performed at [facility], [place] [address], [place], [place] [address]      DG Chest 1 View  Result Date: 07/22/2021 CLINICAL DATA:  Cough, COVID-19 EXAM: CHEST  1 VIEW COMPARISON:  10/30/2018 FINDINGS: Lungs are clear.  No pleural effusion or pneumothorax. The heart is normal in size. IMPRESSION: No evidence of acute cardiopulmonary disease. Electronically Signed   By: Julian Hy M.D.   On: 07/22/2021 20:17     Review of Systems  Constitutional:  Positive for  appetite change, chills and fatigue. Negative for fever.  HENT:  Positive for congestion and ear pain.   Respiratory:  Positive for cough. Negative for chest tightness and shortness of breath.   Physical Exam   Blood pressure 114/66, pulse 92, temperature 98.9 F (37.2 C), resp. rate 18, last menstrual period 03/27/2021, SpO2 100 %, not currently breastfeeding.  Physical Exam Vitals and nursing note reviewed.  Constitutional:      General: She is not in acute distress.    Appearance: She is well-developed. She is not ill-appearing, toxic-appearing or  diaphoretic.  HENT:     Right Ear: Hearing, ear canal and external ear normal. A middle ear effusion is present. Tympanic membrane is not injected or erythematous.     Left Ear: Hearing, ear canal and external ear normal. A middle ear effusion is present. Tympanic membrane is not injected or erythematous.  Cardiovascular:     Heart sounds: Normal heart sounds.  Pulmonary:     Effort: Pulmonary effort is normal.     Breath sounds: Normal breath sounds. No wheezing or rhonchi.  Skin:    General: Skin is warm.  Neurological:     Mental Status: She is alert and oriented to person, place, and time.     GCS: GCS eye subscore is 4. GCS verbal subscore is 5. GCS motor subscore is 6.  Psychiatric:        Behavior: Behavior normal.   MAU Course  Procedures  MDM  + fetal heart tones via doppler.  GNP:HQNETU sinus rhythm Troponin negative Chest Xray without acute findings.   Assessment and Plan   A:  1. Acute cough   2. COVID-19   3. Congestion of both ears   4. [redacted] weeks gestation of pregnancy      P:  Discharge home Return to MAU if symptoms worsen REST Increase fluids OTC tylenol is ok, ibuprofen ok sparingly prior to 28 weeks Rx: Tess Perles, phenergan cough.    Lezlie Lye, NP 07/23/2021 2:57 PM

## 2021-07-22 NOTE — MAU Note (Signed)
Pt reports negative covid test 2 days ago. Sore throat x day, headache, bilateral ear pain, vomiting. Chest pain for four days off;/on. Denies fever.

## 2021-08-08 ENCOUNTER — Ambulatory Visit: Payer: Medicaid Other

## 2021-08-16 ENCOUNTER — Encounter: Payer: Medicaid Other | Admitting: Medical

## 2021-08-19 NOTE — L&D Delivery Note (Signed)
Delivery Note Briana Curry is a 21 y.o. G2P1001 at 74w1dadmitted for IOL d/t postdates.   GBS Status: Negative/-- (04/18 1516) Maximum Maternal Temperature: 98.1  Labor course: Initial SVE: 1/thick/posterior. Augmentation with: AROM, Pitocin, Cytotec, and IP Foley. She then progressed to complete.  ROM: 6h 274mith clear fluid  Birth: At [place] viable female was delivered via spontaneous vaginal delivery (Presentation: LOA). Nuchal cord present: No.  Shoulders and body delivered in usual fashion. Infant placed directly on mom's abdomen for bonding/skin-to-skin, baby dried and stimulated. Cord clamped x 2 after 1 minute and cut by pt's sister.  Cord blood collected.  The placenta separated spontaneously and delivered via gentle cord traction.  Pitocin infused rapidly IV per protocol.  Fundus firm with massage.  Placenta inspected and appears to be intact with a 3 VC.  Placenta/Cord with the following complications: none .  Cord pH: not done Sponge and instrument count were correct x2.  Intrapartum complications:  None Anesthesia:  epidural Episiotomy: none Lacerations:  1st degree-repaired, Rt periurethral-hemostatic, not repaired Suture Repair: 3.0 vicryl EBL (mL): 150   Infant: APGAR (1 MIN): 9   APGAR (5 MINS): 9   APGAR (10 MINS):    Infant weight: pending  Mom to postpartum.  Baby to Couplet care / Skin to Skin. Placenta to L&D   Plans to Bottlefeed Contraception:  undecided Circumcision: N/A  [facility].  KiRoma SchanzNM, [facility]/24/2023 1:34 AM

## 2021-09-03 ENCOUNTER — Encounter: Payer: Self-pay | Admitting: Obstetrics and Gynecology

## 2021-09-03 ENCOUNTER — Other Ambulatory Visit (HOSPITAL_COMMUNITY)
Admission: RE | Admit: 2021-09-03 | Discharge: 2021-09-03 | Disposition: A | Discharge status: Home / Self Care | Payer: Medicaid Other | Source: Ambulatory Visit | Attending: Medical | Admitting: Medical

## 2021-09-03 ENCOUNTER — Ambulatory Visit (INDEPENDENT_AMBULATORY_CARE_PROVIDER_SITE_OTHER): Payer: Medicaid Other | Admitting: Obstetrics and Gynecology

## 2021-09-03 ENCOUNTER — Other Ambulatory Visit: Payer: Self-pay

## 2021-09-03 VITALS — BP 111/74 | HR 85 | Wt 220.0 lb

## 2021-09-03 DIAGNOSIS — Z3A22 22 weeks gestation of pregnancy: Secondary | ICD-10-CM | POA: Diagnosis not present

## 2021-09-03 DIAGNOSIS — Z3482 Encounter for supervision of other normal pregnancy, second trimester: Secondary | ICD-10-CM

## 2021-09-03 DIAGNOSIS — Z8616 Personal history of COVID-19: Secondary | ICD-10-CM | POA: Insufficient documentation

## 2021-09-03 DIAGNOSIS — Z3480 Encounter for supervision of other normal pregnancy, unspecified trimester: Secondary | ICD-10-CM | POA: Insufficient documentation

## 2021-09-03 DIAGNOSIS — R8271 Bacteriuria: Secondary | ICD-10-CM

## 2021-09-03 NOTE — Progress Notes (Signed)
Subjective:  Briana Curry is a 21 y.o. G2P1001 at [redacted]w[redacted]d being seen today for ongoing prenatal care.  She is currently monitored for the following issues for this low-risk pregnancy and has BMI 40.0-44.9, adult ([place]); Lactose intolerance; Other allergic rhinitis; Obesity (BMI 30-39.9); Encounter for supervision of normal pregnancy, antepartum; GBS bacteriuria; and History of COVID-19 on their problem list.  Patient reports vaginal discharge.  Contractions: Not present. Vag. Bleeding: None.  Movement: Present. Denies leaking of fluid.   The following portions of the patient's history were reviewed and updated as appropriate: allergies, current medications, past family history, past medical history, past social history, past surgical history and problem list. Problem list updated.  Objective:   Vitals:   09/03/21 1359  BP: 111/74  Pulse: 85  Weight: 220 lb (99.8 kg)    Fetal Status: Fetal Heart Rate (bpm): 145   Movement: Present     General:  Alert, oriented and cooperative. Patient is in no acute distress.  Skin: Skin is warm and dry. No rash noted.   Cardiovascular: Normal heart rate noted  Respiratory: Normal respiratory effort, no problems with respiration noted  Abdomen: Soft, gravid, appropriate for gestational age. Pain/Pressure: Absent     Pelvic:  Cervical exam deferred        Extremities: Normal range of motion.  Edema: None  Mental Status: Normal mood and affect. Normal behavior. Normal judgment and thought content.   Urinalysis:      Assessment and Plan:  Pregnancy: G2P1001 at [redacted]w[redacted]d  1. Supervision of other normal pregnancy, antepartum Stable Self swab Anatomy scan tomorrow To late for AFP - Cervicovaginal ancillary only( [place])  2. GBS bacteriuria Tx while in labor  3. History of COVID-19   Preterm labor symptoms and general obstetric precautions including but not limited to vaginal bleeding, contractions, leaking of fluid and fetal movement were  reviewed in detail with the patient. Please refer to After Visit Summary for other counseling recommendations.  Return in about 4 weeks (around 10/01/2021) for OB visit, face to face, any provider, fasting for Glucola.   Chancy Milroy, MD

## 2021-09-03 NOTE — Patient Instructions (Signed)

## 2021-09-04 ENCOUNTER — Ambulatory Visit: Payer: Medicaid Other | Attending: Obstetrics | Admitting: *Deleted

## 2021-09-04 ENCOUNTER — Other Ambulatory Visit: Payer: Self-pay | Admitting: Obstetrics

## 2021-09-04 ENCOUNTER — Other Ambulatory Visit: Payer: Self-pay | Admitting: *Deleted

## 2021-09-04 ENCOUNTER — Ambulatory Visit (HOSPITAL_BASED_OUTPATIENT_CLINIC_OR_DEPARTMENT_OTHER): Payer: Medicaid Other

## 2021-09-04 VITALS — BP 114/60 | HR 86

## 2021-09-04 DIAGNOSIS — O99212 Obesity complicating pregnancy, second trimester: Secondary | ICD-10-CM

## 2021-09-04 DIAGNOSIS — Z3480 Encounter for supervision of other normal pregnancy, unspecified trimester: Secondary | ICD-10-CM

## 2021-09-04 DIAGNOSIS — R8271 Bacteriuria: Secondary | ICD-10-CM

## 2021-09-04 DIAGNOSIS — Z3A23 23 weeks gestation of pregnancy: Secondary | ICD-10-CM | POA: Insufficient documentation

## 2021-09-04 DIAGNOSIS — Z87891 Personal history of nicotine dependence: Secondary | ICD-10-CM | POA: Insufficient documentation

## 2021-09-04 LAB — CERVICOVAGINAL ANCILLARY ONLY
Bacterial Vaginitis (gardnerella): NEGATIVE
Candida Glabrata: NEGATIVE
Candida Vaginitis: NEGATIVE
Chlamydia: NEGATIVE
Comment: NEGATIVE
Comment: NEGATIVE
Comment: NEGATIVE
Comment: NEGATIVE
Comment: NEGATIVE
Comment: NORMAL
Neisseria Gonorrhea: NEGATIVE
Trichomonas: NEGATIVE

## 2021-09-22 ENCOUNTER — Encounter (HOSPITAL_COMMUNITY): Payer: Self-pay | Admitting: Emergency Medicine

## 2021-09-22 ENCOUNTER — Other Ambulatory Visit: Payer: Self-pay

## 2021-09-22 ENCOUNTER — Ambulatory Visit (HOSPITAL_COMMUNITY)
Admission: EM | Admit: 2021-09-22 | Discharge: 2021-09-22 | Disposition: A | Discharge status: Home / Self Care | Payer: Medicaid Other | Attending: Emergency Medicine | Admitting: Emergency Medicine

## 2021-09-22 DIAGNOSIS — B9789 Other viral agents as the cause of diseases classified elsewhere: Secondary | ICD-10-CM | POA: Insufficient documentation

## 2021-09-22 DIAGNOSIS — Z3A25 25 weeks gestation of pregnancy: Secondary | ICD-10-CM | POA: Diagnosis not present

## 2021-09-22 DIAGNOSIS — J069 Acute upper respiratory infection, unspecified: Secondary | ICD-10-CM

## 2021-09-22 DIAGNOSIS — O99512 Diseases of the respiratory system complicating pregnancy, second trimester: Secondary | ICD-10-CM | POA: Insufficient documentation

## 2021-09-22 DIAGNOSIS — Z20822 Contact with and (suspected) exposure to covid-19: Secondary | ICD-10-CM | POA: Diagnosis not present

## 2021-09-22 DIAGNOSIS — J45909 Unspecified asthma, uncomplicated: Secondary | ICD-10-CM | POA: Diagnosis not present

## 2021-09-22 NOTE — ED Provider Notes (Signed)
[place]    CSN: [id number] Arrival date & time: 09/22/21  1226      History   Chief Complaint Chief Complaint  Patient presents with   Cough   Nasal Congestion    HPI Briana Curry is a 21 y.o. female.   Patient presents with nasal congestion, rhinorrhea, bilateral ear pain and pressure, sore throat, nonproductive cough and generalized headaches for 4 days.  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  Tolerating food and liquids.  Has attempted use of over-the-counter Tylenol which has not been helpful.  History of asthma.  Currently [redacted] weeks pregnant.  Endorses fetal movement, denies abdominal pain or vaginal bleeding or spotting.  Past Medical History:  Diagnosis Date   Anemia    Asthma    Broken arm    Iron deficiency    Multiple allergies     Patient Active Problem List   Diagnosis Date Noted   History of COVID-19 09/03/2021   GBS bacteriuria 07/22/2021   Encounter for supervision of normal pregnancy, antepartum 05/29/2021   Obesity (BMI 30-39.9) 09/09/2019   Other allergic rhinitis 09/21/2018   Lactose intolerance 11/03/2017   BMI 40.0-44.9, adult ([place]) 07/25/2016    Past Surgical History:  Procedure Laterality Date   ARM HARDWARE REMOVAL      OB History     Gravida  2   Para  1   Term  1   Preterm      AB      Living  1      SAB      IAB      Ectopic      Multiple  0   Live Births  1            Home Medications    Prior to Admission medications   Medication Sig Start Date End Date Taking? Authorizing Provider  Blood Pressure Monitoring (BLOOD PRESSURE KIT) DEVI 1 kit by Does not apply route once a week. Patient not taking: Reported on 09/04/2021 05/29/21   Woodroe Mode, MD  Misc. Devices (GOJJI WEIGHT SCALE) MISC 1 Device by Does not apply route every 30 (thirty) days. Patient not taking: Reported on 09/04/2021 05/29/21   Woodroe Mode, MD  Prenat w/o A Vit-FeFum-FePo-FA (CONCEPT OB) 130-92.4-1  MG CAPS Take 1 capsule by mouth daily. 07/19/21   Shelly Bombard, MD    Family History Family History  Problem Relation Age of Onset   Asthma Mother    Asthma Sister    Eczema Sister    Urticaria Sister    Asthma Brother    Eczema Brother     Social History Social History   Tobacco Use   Smoking status: Former    Types: Cigars    Quit date: 03/2021    Years since quitting: 0.5   Smokeless tobacco: Never  Vaping Use   Vaping Use: Never used  Substance Use Topics   Alcohol use: Not Currently    Comment: not while preg   Drug use: No     Allergies   Shrimp [shellfish allergy], Lactose intolerance (gi), and Other   Review of Systems Review of Systems  Constitutional: Negative.   HENT:  Positive for congestion, ear pain, rhinorrhea and sore throat. Negative for dental problem, drooling, ear discharge, facial swelling, hearing loss, mouth sores, nosebleeds, postnasal drip, sinus pressure, sinus pain, sneezing, tinnitus, trouble swallowing and voice change.   Respiratory:  Positive for  cough. Negative for apnea, choking, chest tightness, shortness of breath and stridor.   Cardiovascular: Negative.   Gastrointestinal: Negative.   Musculoskeletal: Negative.   Skin: Negative.   Neurological:  Positive for headaches. Negative for dizziness, tremors, seizures, syncope, facial asymmetry, speech difficulty, weakness, light-headedness and numbness.    Physical Exam Triage Vital Signs ED Triage Vitals  Enc Vitals Group     BP 09/22/21 1346 (!) 105/49     Pulse Rate 09/22/21 1346 86     Resp 09/22/21 1346 19     Temp 09/22/21 1346 99 F (37.2 C)     Temp Source 09/22/21 1346 Oral     SpO2 09/22/21 1346 99 %     Weight --      Height --      Head Circumference --      Peak Flow --      Pain Score 09/22/21 1345 10     Pain Loc --      Pain Edu? --      Excl. in [place]? --    No data found.  Updated Vital Signs BP (!) 105/49 (BP Location: Left Arm)    Pulse 86    Temp  99 F (37.2 C) (Oral)    Resp 19    LMP 03/27/2021    SpO2 99%   Visual Acuity Right Eye Distance:   Left Eye Distance:   Bilateral Distance:    Right Eye Near:   Left Eye Near:    Bilateral Near:     Physical Exam Constitutional:      Appearance: Normal appearance.  HENT:     Head: Normocephalic.     Right Ear: Tympanic membrane, ear canal and external ear normal.     Left Ear: Tympanic membrane, ear canal and external ear normal.     Nose: Rhinorrhea present.     Right Turbinates: Swollen.     Left Turbinates: Swollen.     Mouth/Throat:     Mouth: Mucous membranes are moist.     Pharynx: Oropharynx is clear.  Eyes:     Extraocular Movements: Extraocular movements intact.  Cardiovascular:     Rate and Rhythm: Normal rate and regular rhythm.     Pulses: Normal pulses.     Heart sounds: Normal heart sounds.  Pulmonary:     Effort: Pulmonary effort is normal.     Breath sounds: Normal breath sounds.  Musculoskeletal:     Cervical back: Normal range of motion and neck supple.  Skin:    General: Skin is warm and dry.  Neurological:     Mental Status: She is alert and oriented to person, place, and time. Mental status is at baseline.  Psychiatric:        Mood and Affect: Mood normal.        Behavior: Behavior normal.     UC Treatments / Results  Labs (all labs ordered are listed, but only abnormal results are displayed) Labs Reviewed - No data to display  EKG   Radiology No results found.  Procedures Procedures (including critical care time)  Medications Ordered in UC Medications - No data to display  Initial Impression / Assessment and Plan / UC Course  I have reviewed the triage vital signs and the nursing notes.  Pertinent labs & imaging results that were available during my care of the patient were reviewed by me and considered in my medical decision making (see chart for details).  Viral URI with cough  COVID test pending, etiology of symptoms  most likely viral, discussed with patient, vital signs are stable, no signs of distress, okay for outpatient treatment, given handout of safe over-the-counter medications during pregnancy, work note given, may follow-up with urgent care as needed  Final Clinical Impressions(s) / UC Diagnoses   Final diagnoses:  None   Discharge Instructions   None    ED Prescriptions   None    PDMP not reviewed this encounter.   Hans Eden, NP 09/22/21 1402

## 2021-09-22 NOTE — ED Triage Notes (Signed)
Pt [redacted] weeks pregnant. C/o cough, congestion, sore throat x 4 days. Reports worse at night when trying to lay down.

## 2021-09-22 NOTE — Discharge Instructions (Signed)
We will contact you if your COVID test is positive.  Please quarantine while you wait for the results.  If your test is negative you may resume normal activities.  If your test is positive please continue to quarantine for at least 5 days from your symptom onset or until you are without a fever for at least 24 hours after the medications.  The second pain of your packet is a list of medications that is safe for you to use during pregnancy to help with your symptoms.

## 2021-09-23 LAB — SARS CORONAVIRUS 2 (TAT 6-24 HRS): SARS Coronavirus 2: NEGATIVE

## 2021-10-02 ENCOUNTER — Other Ambulatory Visit: Payer: Self-pay

## 2021-10-02 ENCOUNTER — Ambulatory Visit: Payer: Medicaid Other | Attending: Obstetrics

## 2021-10-02 ENCOUNTER — Ambulatory Visit: Payer: Medicaid Other | Admitting: *Deleted

## 2021-10-02 VITALS — BP 109/63 | HR 94

## 2021-10-02 DIAGNOSIS — Z3480 Encounter for supervision of other normal pregnancy, unspecified trimester: Secondary | ICD-10-CM

## 2021-10-02 DIAGNOSIS — Z362 Encounter for other antenatal screening follow-up: Secondary | ICD-10-CM | POA: Diagnosis not present

## 2021-10-02 DIAGNOSIS — R8271 Bacteriuria: Secondary | ICD-10-CM

## 2021-10-02 DIAGNOSIS — Z3A27 27 weeks gestation of pregnancy: Secondary | ICD-10-CM | POA: Insufficient documentation

## 2021-10-02 DIAGNOSIS — O321XX0 Maternal care for breech presentation, not applicable or unspecified: Secondary | ICD-10-CM | POA: Diagnosis not present

## 2021-10-02 DIAGNOSIS — O99212 Obesity complicating pregnancy, second trimester: Secondary | ICD-10-CM | POA: Insufficient documentation

## 2021-10-02 DIAGNOSIS — E668 Other obesity: Secondary | ICD-10-CM

## 2021-10-03 ENCOUNTER — Ambulatory Visit (INDEPENDENT_AMBULATORY_CARE_PROVIDER_SITE_OTHER): Payer: Medicaid Other | Admitting: Obstetrics

## 2021-10-03 ENCOUNTER — Encounter: Payer: Self-pay | Admitting: Obstetrics

## 2021-10-03 ENCOUNTER — Other Ambulatory Visit: Payer: Medicaid Other

## 2021-10-03 ENCOUNTER — Other Ambulatory Visit (HOSPITAL_COMMUNITY)
Admission: RE | Admit: 2021-10-03 | Discharge: 2021-10-03 | Disposition: A | Discharge status: Home / Self Care | Payer: Medicaid Other | Source: Ambulatory Visit | Attending: Obstetrics | Admitting: Obstetrics

## 2021-10-03 ENCOUNTER — Other Ambulatory Visit: Payer: Self-pay | Admitting: *Deleted

## 2021-10-03 VITALS — BP 124/75 | HR 90 | Wt 226.0 lb

## 2021-10-03 DIAGNOSIS — Z3480 Encounter for supervision of other normal pregnancy, unspecified trimester: Secondary | ICD-10-CM | POA: Insufficient documentation

## 2021-10-03 DIAGNOSIS — Z3689 Encounter for other specified antenatal screening: Secondary | ICD-10-CM

## 2021-10-03 DIAGNOSIS — O283 Abnormal ultrasonic finding on antenatal screening of mother: Secondary | ICD-10-CM

## 2021-10-03 DIAGNOSIS — B070 Plantar wart: Secondary | ICD-10-CM

## 2021-10-03 DIAGNOSIS — R638 Other symptoms and signs concerning food and fluid intake: Secondary | ICD-10-CM

## 2021-10-03 NOTE — Progress Notes (Signed)
Subjective:  Briana Curry is a 21 y.o. G2P1001 at [redacted]w[redacted]d being seen today for ongoing prenatal care.  She is currently monitored for the following issues for this low-risk pregnancy and has BMI 40.0-44.9, adult ([place]); Lactose intolerance; Other allergic rhinitis; Obesity (BMI 30-39.9); Encounter for supervision of normal pregnancy, antepartum; GBS bacteriuria; and History of COVID-19 on their problem list.  Patient reports backache and sore foot  Contractions: Not present. Vag. Bleeding: None.  Movement: Present. Denies leaking of fluid.   The following portions of the patient's history were reviewed and updated as appropriate: allergies, current medications, past family history, past medical history, past social history, past surgical history and problem list. Problem list updated.  Objective:   Vitals:   10/03/21 0813  BP: 124/75  Pulse: 90  Weight: 226 lb (102.5 kg)    Fetal Status: Fetal Heart Rate (bpm): 142   Movement: Present     General:  Alert, oriented and cooperative. Patient is in no acute distress.  Skin: Skin is warm and dry. No rash noted.   Cardiovascular: Normal heart rate noted  Respiratory: Normal respiratory effort, no problems with respiration noted  Abdomen: Soft, gravid, appropriate for gestational age. Pain/Pressure: Absent     Pelvic:  Cervical exam deferred        Extremities: Normal range of motion.  Edema: None.  Left foot: Plantar Wart  Mental Status: Normal mood and affect. Normal behavior. Normal judgment and thought content.   Urinalysis:      Assessment and Plan:  Pregnancy: G2P1001 at [redacted]w[redacted]d  1. Supervision of other normal pregnancy, antepartum Rx: - Cervicovaginal ancillary only( [place])  2. Plantar wart Rx: - Ambulatory referral to Podiatry   Preterm labor symptoms and general obstetric precautions including but not limited to vaginal bleeding, contractions, leaking of fluid and fetal movement were reviewed in detail with the  patient. Please refer to After Visit Summary for other counseling recommendations.   Return in about 5 days (around 10/08/2021) for 2 hour OGTT ( Lab only ).   Shelly Bombard, MD  10/03/21

## 2021-10-03 NOTE — Progress Notes (Signed)
ROB 27wks Painful place on right foot Painful swollen place beside right ear Symptoms of BV  Scheduled for GTT today: was not NPO, will reschedule for GTT and 28 wk labs

## 2021-10-04 ENCOUNTER — Encounter: Payer: Self-pay | Admitting: Obstetrics

## 2021-10-04 LAB — CERVICOVAGINAL ANCILLARY ONLY
Bacterial Vaginitis (gardnerella): POSITIVE — AB
Candida Glabrata: NEGATIVE
Candida Vaginitis: NEGATIVE
Chlamydia: NEGATIVE
Comment: NEGATIVE
Comment: NEGATIVE
Comment: NEGATIVE
Comment: NEGATIVE
Comment: NEGATIVE
Comment: NORMAL
Neisseria Gonorrhea: NEGATIVE
Trichomonas: NEGATIVE

## 2021-10-05 ENCOUNTER — Other Ambulatory Visit: Payer: Self-pay | Admitting: Obstetrics

## 2021-10-05 DIAGNOSIS — B9689 Other specified bacterial agents as the cause of diseases classified elsewhere: Secondary | ICD-10-CM

## 2021-10-05 MED ORDER — METRONIDAZOLE 500 MG PO TABS: 500.0000 mg | ORAL_TABLET | Freq: Two times a day (BID) | ORAL | 2 refills | Status: DC

## 2021-10-08 ENCOUNTER — Other Ambulatory Visit: Payer: Medicaid Other

## 2021-10-08 ENCOUNTER — Other Ambulatory Visit: Payer: Self-pay

## 2021-10-08 ENCOUNTER — Ambulatory Visit: Payer: Medicaid Other | Admitting: Podiatry

## 2021-10-08 DIAGNOSIS — Z3480 Encounter for supervision of other normal pregnancy, unspecified trimester: Secondary | ICD-10-CM

## 2021-10-09 LAB — CBC
Hematocrit: 32.1 % — ABNORMAL LOW (ref 34.0–46.6)
Hemoglobin: 11.2 g/dL (ref 11.1–15.9)
MCH: 30.9 pg (ref 26.6–33.0)
MCHC: 34.9 g/dL (ref 31.5–35.7)
MCV: 88 fL (ref 79–97)
Platelets: 275 10*3/uL (ref 150–450)
RBC: 3.63 x10E6/uL — ABNORMAL LOW (ref 3.77–5.28)
RDW: 11.8 % (ref 11.7–15.4)
WBC: 7.7 10*3/uL (ref 3.4–10.8)

## 2021-10-09 LAB — GLUCOSE TOLERANCE, 2 HOURS W/ 1HR
Glucose, 1 hour: 96 mg/dL (ref 70–179)
Glucose, 2 hour: 81 mg/dL (ref 70–152)
Glucose, Fasting: 77 mg/dL (ref 70–91)

## 2021-10-09 LAB — RPR: RPR Ser Ql: NONREACTIVE

## 2021-10-09 LAB — HIV ANTIBODY (ROUTINE TESTING W REFLEX): HIV Screen 4th Generation wRfx: NONREACTIVE

## 2021-10-14 ENCOUNTER — Other Ambulatory Visit: Payer: Self-pay | Admitting: Obstetrics

## 2021-10-14 DIAGNOSIS — B9689 Other specified bacterial agents as the cause of diseases classified elsewhere: Secondary | ICD-10-CM

## 2021-10-14 DIAGNOSIS — N760 Acute vaginitis: Secondary | ICD-10-CM

## 2021-10-17 ENCOUNTER — Other Ambulatory Visit: Payer: Self-pay

## 2021-10-17 ENCOUNTER — Ambulatory Visit (INDEPENDENT_AMBULATORY_CARE_PROVIDER_SITE_OTHER): Payer: Medicaid Other | Admitting: Obstetrics

## 2021-10-17 ENCOUNTER — Encounter: Payer: Self-pay | Admitting: Obstetrics

## 2021-10-17 VITALS — BP 118/74 | HR 97 | Wt 228.0 lb

## 2021-10-17 DIAGNOSIS — Z3480 Encounter for supervision of other normal pregnancy, unspecified trimester: Secondary | ICD-10-CM

## 2021-10-17 DIAGNOSIS — R8271 Bacteriuria: Secondary | ICD-10-CM

## 2021-10-17 MED ORDER — PRENATAL PLUS VITAMIN/MINERAL 27-1 MG PO TABS: 1.0000 | ORAL_TABLET | Freq: Every day | ORAL | 2 refills | Status: DC

## 2021-10-17 NOTE — Progress Notes (Signed)
Subjective:  ?Briana Curry is a 21 y.o. G2P1001 at [redacted]w[redacted]d being seen today for ongoing prenatal care.  She is currently monitored for the following issues for this low-risk pregnancy and has BMI 40.0-44.9, adult ([place]); Lactose intolerance; Other allergic rhinitis; Obesity (BMI 30-39.9); Encounter for supervision of normal pregnancy, antepartum; GBS bacteriuria; and History of COVID-19 on their problem list. ? ?Patient reports backache.   .  .   . Denies leaking of fluid.  ? ?The following portions of the patient's history were reviewed and updated as appropriate: allergies, current medications, past family history, past medical history, past social history, past surgical history and problem list. Problem list updated. ? ?Objective:  ?There were no vitals filed for this visit. ? ?Fetal Status:          ? ?General:  Alert, oriented and cooperative. Patient is in no acute distress.  ?Skin: Skin is warm and dry. No rash noted.   ?Cardiovascular: Normal heart rate noted  ?Respiratory: Normal respiratory effort, no problems with respiration noted  ?Abdomen: Soft, gravid, appropriate for gestational age.       ?Pelvic:  Cervical exam deferred        ?Extremities: Normal range of motion.     ?Mental Status: Normal mood and affect. Normal behavior. Normal judgment and thought content.  ? ?Urinalysis:     ? ?Assessment and Plan:  ?Pregnancy: G2P1001 at [redacted]w[redacted]d ? ?1. Supervision of other normal pregnancy, antepartum ? ?2. GBS bacteriuria, treated ?- treat in labor ? ?Preterm labor symptoms and general obstetric precautions including but not limited to vaginal bleeding, contractions, leaking of fluid and fetal movement were reviewed in detail with the patient. ?Please refer to After Visit Summary for other counseling recommendations.  ? ?Return in about 2 weeks (around 10/31/2021) for [place]. ? ? ?Shelly Bombard, MD  ?10/17/21  ?

## 2021-10-25 ENCOUNTER — Other Ambulatory Visit: Payer: Self-pay

## 2021-10-25 ENCOUNTER — Ambulatory Visit (INDEPENDENT_AMBULATORY_CARE_PROVIDER_SITE_OTHER): Payer: Medicaid Other | Admitting: Podiatry

## 2021-10-25 DIAGNOSIS — D492 Neoplasm of unspecified behavior of bone, soft tissue, and skin: Secondary | ICD-10-CM

## 2021-10-25 NOTE — Patient Instructions (Signed)
Plantar Warts ?Plantar warts are small growths on the bottom of the foot (sole). Warts are caused by a type of germ (virus). Most warts are not painful, and they usually do not cause problems. Sometimes, plantar warts can cause pain when you walk. ?Warts often go away on their own in time. They can also spread to other areas of the body. Treatments may be done if needed. ?What are the causes? ?Plantar warts are caused by a germ that is called human papillomavirus (HPV). ?Walking barefoot can cause exposure to the germ, especially if your feet are wet. ?Warts happen when HPV attacks a break in the skin of the foot. ?What increases the risk? ?Being between 25 and 24 years of age. ?Using public showers or locker rooms. ?Having a weakened body defense system (immune system). ?What are the signs or symptoms? ? ?Flat or slightly raised growths that have a rough surface and look like a callus. ?Pain when you use your foot to support your body weight. ?How is this treated? ?In many cases, warts do not need treatment. Without treatment, they often go away with time. If treatment is needed or wanted, options may include: ?Applying medicated solutions, creams, or patches to the wart. These make the skin soft so that layers will slowly shed away. ?Freezing the wart with liquid nitrogen (cryotherapy). ?Burning the wart with: ?Laser treatment. ?An electrified probe (electrocautery). ?Injecting a medicine (Candida antigen) into the wart to help the body's defense system fight off the wart. ?Having surgery to remove the wart. ?Putting duct tape over the top of the wart (occlusion). You will leave the tape in place for as long as told by your doctor. Then you will replace it with a new strip of tape. This is done until the wart goes away. ?Repeat treatment may be needed if you choose to remove warts. Warts sometimes go away and come back again. ?Follow these instructions at home: ?General instructions ?Apply creams or solutions only  as told by your doctor. Follow these steps if your doctor tells you to do so: ?Soak your foot in warm water. ?Remove the top layer of softened skin before you apply the medicine. You can use a pumice stone to remove the skin. ?After you apply the medicine, put a bandage over the area of the wart. ?Repeat the process every day or as told by your doctor. ?Do not scratch or pick at a wart. ?Wash your hands after you touch a wart. ?If a wart hurts, try covering it with a bandage that has a hole in the middle. ?Keep all follow-up visits as told by your doctor. This is important. ?How is this prevented? ? ?Wear shoes and socks. Change your socks every day. ?Keep your feet clean and dry. ?Check your feet often. ?Do not walk barefoot in: ?Shared locker rooms. ?Shower areas. ?Swimming pools. ?Avoid direct contact with warts on other people. ?Contact a doctor if: ?Your warts do not improve after treatment. ?You have redness, swelling, or pain at the site of a wart. ?You have bleeding from a wart, and the bleeding does not stop when you put light pressure on the wart. ?You have diabetes and you get a wart. ?Summary ?Warts are small growths on the skin. ?When warts happen on the bottom of the foot (sole), they are called plantar warts. ?In many cases, warts do not need treatment. ?Apply creams or solutions only as told by your doctor. ?Do not scratch or pick at a wart. Wash your hands  after you touch a wart. ?This information is not intended to replace advice given to you by your health care provider. Make sure you discuss any questions you have with your health care provider. ?Document Revised: 11/23/2020 Document Reviewed: 11/23/2020 ?Elsevier Patient Education ? [place]. ? ?

## 2021-10-30 NOTE — Progress Notes (Signed)
Subjective:  ? ?Patient ID: Briana Curry, female   DOB: 21 y.o.   MRN: [id number]  ? ?HPI ?21 year old female presents the office today for concerns of callus, wart on the bottom of her heel.  She states that about 3 months ago she feels like she is stepping on something.  She states that she may have stepped on something in the bathtub but she did not recall any puncture wound or any bleeding at that time.  She has not had any change approximately swelling.  The area is tender mostly when pressure is applied and wearing shoes. ? ? ?Review of Systems  ?All other systems reviewed and are negative. ? ?Past Medical History:  ?Diagnosis Date  ? Anemia   ? Asthma   ? Broken arm   ? Iron deficiency   ? Multiple allergies   ? ? ?Past Surgical History:  ?Procedure Laterality Date  ? ARM HARDWARE REMOVAL    ? ? ? ?Current Outpatient Medications:  ?  metroNIDAZOLE (METROGEL) 0.75 % vaginal gel, PLACE 1 APPLICATORFUL VAGINALLY AT BEDTIME. INSERT ONE APPLICATOR, AT BEDTIME, FOR 5 NIGHTS., Disp: 70 g, Rfl: 0 ?  Prenatal Vit-Fe Fumarate-FA (PRENATAL PLUS VITAMIN/MINERAL) 27-1 MG TABS, Take 1 tablet by mouth daily., Disp: 90 tablet, Rfl: 2 ? ?Allergies  ?Allergen Reactions  ? Shrimp [Shellfish Allergy] Anaphylaxis  ?  Pt states she is also allergic to roaches   ? Lactose Intolerance (Gi) Diarrhea  ? Other Rash  ?  [employer] sweet and sour sauce caused rash and seafood  ? ? ? ? ? ?   ?Objective:  ?Physical Exam  ?General: AAO x3, NAD ? ?Dermatological: On the plantar aspect of the heel there is a thick hyperkeratotic lesion.  Upon debridement it appears to be a verruca as there is a smaller pinpoint bleeding.  I was not able to identify any puncture wound or any opening.  There is no drainage or pus.  No surrounding erythema, ascending cellulitis.  No fluctuation or crepitation.  There is no malodor. ? ?Vascular: Dorsalis Pedis artery and Posterior Tibial artery pedal pulses are 2/4 bilateral with immedate capillary fill time.  There is no pain with calf compression, swelling, warmth, erythema.  ? ?Neruologic: Grossly intact via light touch bilateral.  ? ?Musculoskeletal: Tenderness to skin lesion.  No other areas of discomfort noted today.  Muscular strength 5/5 in all groups tested bilateral. ? ?Gait: Unassisted, Nonantalgic.  ? ? ?   ?Assessment:  ? ?Skin lesion heel ? ?   ?Plan:  ?-Treatment options discussed including all alternatives, risks, and complications ?-Etiology of symptoms were discussed ?-As she is currently [redacted] weeks pregnant we held off on x-rays today.  I sharply debrided the skin lesion with any complications or bleeding.  Not able to visualize any foreign body.  Also I cleaned the wound today but I did hold off any treatment for the verruca including acid treatments given pregnancy.  Recommend moisturizer and offloading and topical medications for now however after she gives birth we can consider further treatments and also will get x-rays.  No signs of infection currently needs to monitor. ? ?Trula Slade DPM ? ?   ? ?

## 2021-10-31 ENCOUNTER — Encounter: Payer: Medicaid Other | Admitting: Obstetrics and Gynecology

## 2021-11-08 ENCOUNTER — Encounter: Payer: Medicaid Other | Admitting: Obstetrics and Gynecology

## 2021-11-14 ENCOUNTER — Ambulatory Visit: Payer: Medicaid Other | Attending: Obstetrics

## 2021-11-14 ENCOUNTER — Ambulatory Visit: Payer: Medicaid Other | Admitting: *Deleted

## 2021-11-14 VITALS — BP 110/60 | HR 96

## 2021-11-14 DIAGNOSIS — Z3A33 33 weeks gestation of pregnancy: Secondary | ICD-10-CM | POA: Diagnosis not present

## 2021-11-14 DIAGNOSIS — O99213 Obesity complicating pregnancy, third trimester: Secondary | ICD-10-CM | POA: Diagnosis not present

## 2021-11-14 DIAGNOSIS — O283 Abnormal ultrasonic finding on antenatal screening of mother: Secondary | ICD-10-CM | POA: Insufficient documentation

## 2021-11-14 DIAGNOSIS — E669 Obesity, unspecified: Secondary | ICD-10-CM | POA: Diagnosis not present

## 2021-11-14 DIAGNOSIS — Z3689 Encounter for other specified antenatal screening: Secondary | ICD-10-CM | POA: Insufficient documentation

## 2021-11-14 DIAGNOSIS — Z3480 Encounter for supervision of other normal pregnancy, unspecified trimester: Secondary | ICD-10-CM

## 2021-11-14 DIAGNOSIS — R638 Other symptoms and signs concerning food and fluid intake: Secondary | ICD-10-CM | POA: Insufficient documentation

## 2021-11-14 DIAGNOSIS — R8271 Bacteriuria: Secondary | ICD-10-CM | POA: Insufficient documentation

## 2021-11-14 DIAGNOSIS — Z362 Encounter for other antenatal screening follow-up: Secondary | ICD-10-CM | POA: Diagnosis not present

## 2021-11-17 ENCOUNTER — Other Ambulatory Visit: Payer: Self-pay

## 2021-11-17 ENCOUNTER — Encounter (HOSPITAL_COMMUNITY): Payer: Self-pay | Admitting: Obstetrics & Gynecology

## 2021-11-17 ENCOUNTER — Inpatient Hospital Stay (HOSPITAL_COMMUNITY)
Admission: AD | Admit: 2021-11-17 | Discharge: 2021-11-17 | Disposition: A | Discharge status: Home / Self Care | Payer: Medicaid Other | Attending: Obstetrics & Gynecology | Admitting: Obstetrics & Gynecology

## 2021-11-17 DIAGNOSIS — O26893 Other specified pregnancy related conditions, third trimester: Secondary | ICD-10-CM | POA: Diagnosis present

## 2021-11-17 DIAGNOSIS — R102 Pelvic and perineal pain: Secondary | ICD-10-CM | POA: Diagnosis not present

## 2021-11-17 DIAGNOSIS — Z87891 Personal history of nicotine dependence: Secondary | ICD-10-CM | POA: Diagnosis not present

## 2021-11-17 DIAGNOSIS — Z3689 Encounter for other specified antenatal screening: Secondary | ICD-10-CM

## 2021-11-17 DIAGNOSIS — R8271 Bacteriuria: Secondary | ICD-10-CM

## 2021-11-17 DIAGNOSIS — Z3480 Encounter for supervision of other normal pregnancy, unspecified trimester: Secondary | ICD-10-CM

## 2021-11-17 DIAGNOSIS — Z3A33 33 weeks gestation of pregnancy: Secondary | ICD-10-CM | POA: Diagnosis not present

## 2021-11-17 LAB — WET PREP, GENITAL
Sperm: NONE SEEN
Trich, Wet Prep: NONE SEEN
WBC, Wet Prep HPF POC: <10 (ref <10)
Yeast Wet Prep HPF POC: NONE SEEN

## 2021-11-17 LAB — URINALYSIS, ROUTINE W REFLEX MICROSCOPIC
Bilirubin Urine: NEGATIVE
Glucose, UA: NEGATIVE mg/dL
Hgb urine dipstick: NEGATIVE
Ketones, ur: NEGATIVE mg/dL
Nitrite: NEGATIVE
Protein, ur: NEGATIVE mg/dL
Specific Gravity, Urine: 1.026 (ref 1.005–1.030)
pH: 7 (ref 5.0–8.0)

## 2021-11-17 MED ORDER — ACETAMINOPHEN 500 MG PO TABS
1000.0000 mg | ORAL_TABLET | Freq: Once | ORAL | Status: AC
  Administered 2021-11-17: 1000 mg via ORAL
  Filled 2021-11-17: qty 2

## 2021-11-17 MED ORDER — METRONIDAZOLE 500 MG PO TABS
500.0000 mg | ORAL_TABLET | Freq: Two times a day (BID) | ORAL | 0 refills | Status: DC
Start: 2021-11-17 — End: 2021-12-19

## 2021-11-17 MED ORDER — MISC. DEVICES MISC: 0 refills | Status: DC

## 2021-11-17 MED ORDER — CYCLOBENZAPRINE HCL 10 MG PO TABS: 10.0000 mg | ORAL_TABLET | Freq: Two times a day (BID) | ORAL | 0 refills | Status: DC | PRN

## 2021-11-17 NOTE — MAU Provider Note (Signed)
?History  ?  ? ?CSN: [id number] ? ?Arrival date and time: 11/17/21 1349 ? ? Event Date/Time  ? First Provider Initiated Contact with Patient 11/17/21 1418   ?  ? ?Chief Complaint  ?Patient presents with  ? Pelvic Pain  ? ?HPI ?Briana Curry is a 21 y.o. G2P1001 at 11w4dwho presents to MAU with chief complaint of pelvic pain. This is a new problem, onset "several" days ago. 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 Pain score is 9/10. She reports taking 500 mg of Tylenol at 10am and experienced some relief. She denies abdominal pain, vaginal bleeding, DFM, dysuria, fever or recent illness. ? ?[facility] ? ?OB History   ? ? Gravida  ?2  ? Para  ?1  ? Term  ?1  ? Preterm  ?   ? AB  ?   ? Living  ?1  ?  ? ? SAB  ?   ? IAB  ?   ? Ectopic  ?   ? Multiple  ?0  ? Live Births  ?1  ?   ?  ?  ? ? ?Past Medical History:  ?Diagnosis Date  ? Anemia   ? Asthma   ? Broken arm   ? Iron deficiency   ? Multiple allergies   ? ? ?Past Surgical History:  ?Procedure Laterality Date  ? ARM HARDWARE REMOVAL    ? ? ?Family History  ?Problem Relation Age of Onset  ? Asthma Mother   ? Asthma Sister   ? Eczema Sister   ? Urticaria Sister   ? Asthma Brother   ? Eczema Brother   ? ? ?Social History  ? ?Tobacco Use  ? Smoking status: Former  ?  Types: Cigars  ?  Quit date: 03/2021  ?  Years since quitting: 0.6  ? Smokeless tobacco: Never  ?Vaping Use  ? Vaping Use: Never used  ?Substance Use Topics  ? Alcohol use: Not Currently  ?  Comment: not while preg  ? Drug use: No  ? ? ?Allergies:  ?Allergies  ?Allergen Reactions  ? Shrimp [Shellfish Allergy] Anaphylaxis  ?  Pt states she is also allergic to roaches   ? Lactose Intolerance (Gi) Diarrhea  ? Other Rash  ?  [employer] sweet and sour sauce caused rash and seafood  ? ? ?Medications Prior to Admission  ?Medication Sig Dispense Refill Last Dose  ? Prenatal Vit-Fe Fumarate-FA  (PRENATAL PLUS VITAMIN/MINERAL) 27-1 MG TABS Take 1 tablet by mouth daily. 90 tablet 2   ? ? ?Review of Systems  ?Constitutional:  Positive for fatigue.  ?Genitourinary:  Positive for pelvic pain.  ?All other systems reviewed and are negative. ?Physical Exam  ? ?Blood pressure 123/68, pulse 93, temperature 98.8 ?F (37.1 ?C), temperature source Oral, resp. rate 18, height '5\' 3"'$  (1.6 m), weight 108.4 kg, last menstrual period 03/27/2021, SpO2 96 %, not currently breastfeeding. ? ?Physical Exam ?Vitals and nursing note reviewed. Exam conducted with a chaperone present.  ?Constitutional:   ?   Appearance: Normal appearance. She is obese. She is not ill-appearing.  ?   Comments: Patient yawning throughout MAU encounter  ?Cardiovascular:  ?   Rate and Rhythm: Normal rate.  ?   Pulses: Normal pulses.  ?Pulmonary:  ?   Effort: Pulmonary effort is normal.  ?Abdominal:  ?   Comments: Gravid  ?Genitourinary: ?  Comments: Pelvic exam: External genitalia normal, vaginal walls pink and well rugated, cervix visually closed, no lesions noted. Thin bluish-white discharge with foul odor c/w Bacterial Vaginosis. Closed cervix confirmed with digital exam. ? ? ?Skin: ?   Capillary Refill: Capillary refill takes less than 2 seconds.  ?Neurological:  ?   Mental Status: She is alert and oriented to person, place, and time.  ?Psychiatric:     ?   Mood and Affect: Mood normal.     ?   Behavior: Behavior normal.     ?   Thought Content: Thought content normal.     ?   Judgment: Judgment normal.  ? ? ?MAU Course  ?Procedures ? ?MDM ? ?--Flexeril held due to patient's lack of ride home from MAU ?--Reactive tracing: baseline 125, mod var, + accels, no decels ?--Toco: quiet ? ?Orders Placed This Encounter  ?Procedures  ? Wet prep, genital  ? Urinalysis, Routine w reflex microscopic Urine, Clean Catch  ? Discharge patient  ? ?Patient Vitals for the past 24 hrs: ? BP Temp Temp src Pulse Resp SpO2 Height Weight  ?11/17/21 1508 (!) 104/49 -- -- 91  -- -- -- --  ?11/17/21 1506 (!) 90/47 -- -- 89 -- -- -- --  ?11/17/21 1402 123/68 98.8 ?F (37.1 ?C) Oral 93 18 96 % -- --  ?11/17/21 1358 -- -- -- -- -- -- '5\' 3"'$  (1.6 m) 108.4 kg  ? ?Results for orders placed or performed during the hospital encounter of 11/17/21 (from the past 24 hour(s))  ?Wet prep, genital     Status: Abnormal  ? Collection Time: 11/17/21  2:27 PM  ?Result Value Ref Range  ? Yeast Wet Prep HPF POC NONE SEEN NONE SEEN  ? Trich, Wet Prep NONE SEEN NONE SEEN  ? Clue Cells Wet Prep HPF POC PRESENT (A) NONE SEEN  ? WBC, Wet Prep HPF POC <10 <10  ? Sperm NONE SEEN   ?Urinalysis, Routine w reflex microscopic Urine, Clean Catch     Status: Abnormal  ? Collection Time: 11/17/21  3:05 PM  ?Result Value Ref Range  ? Color, Urine YELLOW YELLOW  ? APPearance HAZY (A) CLEAR  ? Specific Gravity, Urine 1.026 1.005 - 1.030  ? pH 7.0 5.0 - 8.0  ? Glucose, UA NEGATIVE NEGATIVE mg/dL  ? Hgb urine dipstick NEGATIVE NEGATIVE  ? Bilirubin Urine NEGATIVE NEGATIVE  ? Ketones, ur NEGATIVE NEGATIVE mg/dL  ? Protein, ur NEGATIVE NEGATIVE mg/dL  ? Nitrite NEGATIVE NEGATIVE  ? Leukocytes,Ua TRACE (A) NEGATIVE  ? RBC / HPF 0-5 0 - 5 RBC/hpf  ? WBC, UA 6-10 0 - 5 WBC/hpf  ? Bacteria, UA RARE (A) NONE SEEN  ? Squamous Epithelial / LPF 0-5 0 - 5  ? Mucus PRESENT   ? ?Meds ordered this encounter  ?Medications  ? acetaminophen (TYLENOL) tablet 1,000 mg  ? cyclobenzaprine (FLEXERIL) 10 MG tablet  ?  Sig: Take 1 tablet (10 mg total) by mouth 2 (two) times daily as needed for muscle spasms.  ?  Dispense:  20 tablet  ?  Refill:  0  ?  Order Specific Question:   Supervising Provider  ?  Answer:   [place] Mode [[id number]]  ? Misc. Devices MISC  ?  Sig: Dispense one maternity belt for patient  ?  Dispense:  1 each  ?  Refill:  0  ?  Order Specific Question:   Supervising Provider  ?  Answer:   [place] Mode [  [id number]]  ? metroNIDAZOLE (FLAGYL) 500 MG tablet  ?  Sig: Take 1 tablet (500 mg total) by mouth 2 (two) times daily.  ?  Dispense:  14  tablet  ?  Refill:  0  ?  Order Specific Question:   Supervising Provider  ?  Answer:   [place] Mode [[id number]]  ? ?Assessment and Plan  ?--21 y.o. G2P1001 at [redacted]w[redacted]d ?--Reactive tracing, closed cervix ?--Pain resolved with Tylenol ?--New rx maternity belt, Flexeril ?--Discharge home in stable condition, given work note for today and tomorrow ? ?F/U: ?--[facility] at [facility] ? ?SDarlina Rumpf MSA, MSN, CNM ?11/17/2021, 3:49 PM  ?

## 2021-11-17 NOTE — Discharge Instructions (Signed)

## 2021-11-17 NOTE — MAU Note (Signed)
Briana Curry is a 21 y.o. at 67w4dhere in MAU reporting: increased pelvic pressure for the past few days. Also noticed some increased aching in her back. No bleeding or LOF. +FM ? ?Onset of complaint: ongoing but worse ? ?Pain score: 9/10 ? ?Vitals:  ? 11/17/21 1402  ?BP: 123/68  ?Pulse: 93  ?Resp: 18  ?Temp: 98.8 ?F (37.1 ?C)  ?SpO2: 96%  ?   ?FHT:130 ? ?Lab orders placed from triage: UA ? ?

## 2021-11-19 LAB — GC/CHLAMYDIA PROBE AMP (~~LOC~~) NOT AT ARMC
Chlamydia: POSITIVE — AB
Comment: NEGATIVE
Comment: NORMAL
Neisseria Gonorrhea: NEGATIVE

## 2021-11-20 ENCOUNTER — Ambulatory Visit (INDEPENDENT_AMBULATORY_CARE_PROVIDER_SITE_OTHER): Payer: Medicaid Other | Admitting: Obstetrics

## 2021-11-20 ENCOUNTER — Encounter: Payer: Self-pay | Admitting: Obstetrics

## 2021-11-20 VITALS — BP 124/75 | HR 93 | Wt 237.0 lb

## 2021-11-20 DIAGNOSIS — O98813 Other maternal infectious and parasitic diseases complicating pregnancy, third trimester: Secondary | ICD-10-CM

## 2021-11-20 DIAGNOSIS — A749 Chlamydial infection, unspecified: Secondary | ICD-10-CM

## 2021-11-20 DIAGNOSIS — R8271 Bacteriuria: Secondary | ICD-10-CM

## 2021-11-20 DIAGNOSIS — Z3480 Encounter for supervision of other normal pregnancy, unspecified trimester: Secondary | ICD-10-CM

## 2021-11-20 MED ORDER — AZITHROMYCIN 500 MG PO TABS: 1000.0000 mg | ORAL_TABLET | Freq: Once | ORAL | 0 refills | Status: DC

## 2021-11-20 NOTE — Progress Notes (Signed)
Subjective:  ?Briana Curry is a 21 y.o. G2P1001 at 65w0dbeing seen today for ongoing prenatal care.  She is currently monitored for the following issues for this low-risk pregnancy and has BMI 40.0-44.9, adult [place]; Lactose intolerance; Other allergic rhinitis; Obesity (BMI 30-39.9); Encounter for supervision of normal pregnancy, antepartum; GBS bacteriuria; and History of COVID-19 on their problem list. ? ?Patient reports heartburn and pelvic pressure .  Contractions: Irritability. Vag. Bleeding: None.  Movement: Present. Denies leaking of fluid.  ? ?The following portions of the patient's history were reviewed and updated as appropriate: allergies, current medications, past family history, past medical history, past social history, past surgical history and problem list. Problem list updated. ? ?Objective:  ? ?Vitals:  ? 11/20/21 1528  ?BP: 124/75  ?Pulse: 93  ?Weight: 237 lb (107.5 kg)  ? ? ?Fetal Status: Fetal Heart Rate (bpm): 140   Movement: Present    ? ?General:  Alert, oriented and cooperative. Patient is in no acute distress.  ?Skin: Skin is warm and dry. No rash noted.   ?Cardiovascular: Normal heart rate noted  ?Respiratory: Normal respiratory effort, no problems with respiration noted  ?Abdomen: Soft, gravid, appropriate for gestational age. Pain/Pressure: Present     ?Pelvic:  Cervical exam deferred        ?Extremities: Normal range of motion.     ?Mental Status: Normal mood and affect. Normal behavior. Normal judgment and thought content.  ? ?Urinalysis:     ? ?Assessment and Plan:  ?Pregnancy: G2P1001 at 340w0d ?1. Supervision of other normal pregnancy, antepartum ? ?2. Chlamydia infection affecting pregnancy in third trimester ?Rx: ?- azithromycin (ZITHROMAX) 500 MG tablet; Take 2 tablets (1,000 mg total) by mouth once for 1 dose.  Dispense: 2 tablet; Refill: 0 ? ?3. GBS bacteriuria ?- treat in labor ? ?Preterm labor symptoms and general obstetric precautions including but not limited to  vaginal bleeding, contractions, leaking of fluid and fetal movement were reviewed in detail with the patient. ?Please refer to After Visit Summary for other counseling recommendations.  ? ?Return in about 2 weeks (around 12/04/2021) [place]? ? ?Briana BombardMD  ?11/20/21  ?

## 2021-11-21 ENCOUNTER — Encounter: Payer: Self-pay | Admitting: Advanced Practice Midwife

## 2021-11-21 DIAGNOSIS — A749 Chlamydial infection, unspecified: Secondary | ICD-10-CM | POA: Insufficient documentation

## 2021-11-21 HISTORY — DX: Chlamydial infection, unspecified: A74.9

## 2021-11-28 ENCOUNTER — Other Ambulatory Visit: Payer: Self-pay | Admitting: *Deleted

## 2021-11-28 DIAGNOSIS — A749 Chlamydial infection, unspecified: Secondary | ICD-10-CM

## 2021-11-28 DIAGNOSIS — N760 Acute vaginitis: Secondary | ICD-10-CM

## 2021-11-28 MED ORDER — ONDANSETRON HCL 4 MG PO TABS: 4.0000 mg | ORAL_TABLET | Freq: Three times a day (TID) | ORAL | 0 refills | Status: DC | PRN

## 2021-11-28 MED ORDER — METRONIDAZOLE 0.75 % VA GEL: 1.0000 | Freq: Every day | VAGINAL | 0 refills | Status: DC

## 2021-11-28 MED ORDER — AZITHROMYCIN 500 MG PO TABS: 1000.0000 mg | ORAL_TABLET | Freq: Once | ORAL | 0 refills | Status: AC

## 2021-11-28 NOTE — Progress Notes (Signed)
Pt called to office stating she did not tolerate antibiotics and wants to know if she should take additional dose. ?Reviewed with Dr Rip Harbour, recommends reorder Zithromax along with Zofran. Advised pt to take Zofran 30 min prior to taking Zithromax. ?Also changed flagyl Rx to metrogel per pt request. ? ? ?

## 2021-12-04 ENCOUNTER — Ambulatory Visit (INDEPENDENT_AMBULATORY_CARE_PROVIDER_SITE_OTHER): Payer: Medicaid Other | Admitting: Obstetrics & Gynecology

## 2021-12-04 VITALS — BP 123/79 | HR 103 | Wt 240.0 lb

## 2021-12-04 DIAGNOSIS — O98813 Other maternal infectious and parasitic diseases complicating pregnancy, third trimester: Secondary | ICD-10-CM

## 2021-12-04 DIAGNOSIS — A749 Chlamydial infection, unspecified: Secondary | ICD-10-CM

## 2021-12-04 DIAGNOSIS — Z6841 Body Mass Index (BMI) 40.0 and over, adult: Secondary | ICD-10-CM

## 2021-12-04 DIAGNOSIS — Z3480 Encounter for supervision of other normal pregnancy, unspecified trimester: Secondary | ICD-10-CM

## 2021-12-04 NOTE — Progress Notes (Signed)
? ?  PRENATAL VISIT NOTE ? ?Subjective:  ?Briana Curry is a 21 y.o. G2P1001 at 49w0dbeing seen today for ongoing prenatal care.  She is currently monitored for the following issues for this low-risk pregnancy and has BMI 40.0-44.9, adult ([place]; Lactose intolerance; Other allergic rhinitis; Obesity (BMI 30-39.9); Encounter for supervision of normal pregnancy, antepartum; GBS bacteriuria; History of COVID-19; and Chlamydia infection affecting pregnancy on their problem list. ? ?Patient reports occasional contractions.  Contractions: Irritability.  .  Movement: Present. Denies leaking of fluid.  ? ?The following portions of the patient's history were reviewed and updated as appropriate: allergies, current medications, past family history, past medical history, past social history, past surgical history and problem list.  ? ?Objective:  ? ?Vitals:  ? 12/04/21 1444  ?BP: 123/79  ?Pulse: (!) 103  ?Weight: 240 lb (108.9 kg)  ? ? ?Fetal Status: Fetal Heart Rate (bpm): 132   Movement: Present  Presentation: Undeterminable ? ?General:  Alert, oriented and cooperative. Patient is in no acute distress.  ?Skin: Skin is warm and dry. No rash noted.   ?Cardiovascular: Normal heart rate noted  ?Respiratory: Normal respiratory effort, no problems with respiration noted  ?Abdomen: Soft, gravid, appropriate for gestational age.  Pain/Pressure: Present     ?Pelvic: Cervical exam performed in the presence of a chaperone Dilation: Closed Effacement (%): 30 Station: Ballotable  ?Extremities: Normal range of motion.  Edema: None  ?Mental Status: Normal mood and affect. Normal behavior. Normal judgment and thought content.  ? ?Assessment and Plan:  ?Pregnancy: G2P1001 at 331w0d1. Chlamydia infection affecting pregnancy in third trimester ?TOC after 3 weeks ? ?2. BMI 40.0-44.9, adult ([place]?Body mass index is 42.51 kg/m?. ? ? ?3. Supervision of other normal pregnancy, antepartum ? ?- Culture, beta strep (group b only) ? ?Preterm labor  symptoms and general obstetric precautions including but not limited to vaginal bleeding, contractions, leaking of fluid and fetal movement were reviewed in detail with the patient. ?Please refer to After Visit Summary for other counseling recommendations.  ? ?Return in about 1 week (around 12/11/2021). ? ?Future Appointments  ?Date Time Provider [place]?12/11/2021  2:50 PM HaShelly BombardMD [facility]  ? ? ?JaEmeterio ReeveMD ? ?

## 2021-12-04 NOTE — Progress Notes (Signed)
ROB 36.[redacted] wks GA ?GBS today ?Positive CT on 11/17/21 ?Asking about TOC today ?

## 2021-12-08 LAB — CULTURE, BETA STREP (GROUP B ONLY): Strep Gp B Culture: NEGATIVE

## 2021-12-11 ENCOUNTER — Ambulatory Visit (INDEPENDENT_AMBULATORY_CARE_PROVIDER_SITE_OTHER): Payer: Medicaid Other | Admitting: Obstetrics

## 2021-12-11 VITALS — BP 105/69 | HR 102 | Wt 244.0 lb

## 2021-12-11 DIAGNOSIS — Z6841 Body Mass Index (BMI) 40.0 and over, adult: Secondary | ICD-10-CM

## 2021-12-11 DIAGNOSIS — Z3480 Encounter for supervision of other normal pregnancy, unspecified trimester: Secondary | ICD-10-CM

## 2021-12-12 ENCOUNTER — Encounter: Payer: Self-pay | Admitting: Obstetrics

## 2021-12-12 NOTE — Progress Notes (Signed)
Subjective:  ?Briana Curry is a 21 y.o. G2P1001 at 61w1dbeing seen today for ongoing prenatal care.  She is currently monitored for the following issues for this low-risk pregnancy and has BMI 40.0-44.9, adult ([place]; Lactose intolerance; Other allergic rhinitis; Obesity (BMI 30-39.9); Encounter for supervision of normal pregnancy, antepartum; GBS bacteriuria; History of COVID-19; and Chlamydia infection affecting pregnancy on their problem list. ? ?Patient reports no complaints.  Contractions: Irregular.  .  Movement: Present. Denies leaking of fluid.  ? ?The following portions of the patient's history were reviewed and updated as appropriate: allergies, current medications, past family history, past medical history, past social history, past surgical history and problem list. Problem list updated. ? ?Objective:  ? ?Vitals:  ? 12/11/21 1514  ?BP: 105/69  ?Pulse: (!) 102  ?Weight: 244 lb (110.7 kg)  ? ? ?Fetal Status:     Movement: Present    ? ?General:  Alert, oriented and cooperative. Patient is in no acute distress.  ?Skin: Skin is warm and dry. No rash noted.   ?Cardiovascular: Normal heart rate noted  ?Respiratory: Normal respiratory effort, no problems with respiration noted  ?Abdomen: Soft, gravid, appropriate for gestational age. Pain/Pressure: Present     ?Pelvic:  Cervical exam deferred        ?Extremities: Normal range of motion.     ?Mental Status: Normal mood and affect. Normal behavior. Normal judgment and thought content.  ? ?Urinalysis:     ? ?Assessment and Plan:  ?Pregnancy: G2P1001 at 353w1d ?There are no diagnoses linked to this encounter. ?Term labor symptoms and general obstetric precautions including but not limited to vaginal bleeding, contractions, leaking of fluid and fetal movement were reviewed in detail with the patient. ?Please refer to After Visit Summary for other counseling recommendations.  ? ?Return in about 1 week (around 12/18/2021) for [place]? ? ?HaShelly BombardMD   ?12/11/2021 ?

## 2021-12-19 ENCOUNTER — Encounter: Payer: Self-pay | Admitting: Obstetrics and Gynecology

## 2021-12-19 ENCOUNTER — Ambulatory Visit (INDEPENDENT_AMBULATORY_CARE_PROVIDER_SITE_OTHER): Payer: Medicaid Other | Admitting: Obstetrics and Gynecology

## 2021-12-19 ENCOUNTER — Other Ambulatory Visit (HOSPITAL_COMMUNITY)
Admission: RE | Admit: 2021-12-19 | Discharge: 2021-12-19 | Disposition: A | Discharge status: Home / Self Care | Payer: Medicaid Other | Source: Ambulatory Visit | Attending: Obstetrics and Gynecology | Admitting: Obstetrics and Gynecology

## 2021-12-19 VITALS — BP 127/80 | HR 90 | Wt 243.8 lb

## 2021-12-19 DIAGNOSIS — O98813 Other maternal infectious and parasitic diseases complicating pregnancy, third trimester: Secondary | ICD-10-CM | POA: Insufficient documentation

## 2021-12-19 DIAGNOSIS — A749 Chlamydial infection, unspecified: Secondary | ICD-10-CM | POA: Insufficient documentation

## 2021-12-19 DIAGNOSIS — R8271 Bacteriuria: Secondary | ICD-10-CM

## 2021-12-19 DIAGNOSIS — Z3480 Encounter for supervision of other normal pregnancy, unspecified trimester: Secondary | ICD-10-CM

## 2021-12-19 DIAGNOSIS — Z3A38 38 weeks gestation of pregnancy: Secondary | ICD-10-CM

## 2021-12-19 NOTE — Progress Notes (Signed)
Subjective:  ?Briana Curry is a 21 y.o. G2P1001 at 62w1dbeing seen today for ongoing prenatal care.  She is currently monitored for the following issues for this low-risk pregnancy and has BMI 40.0-44.9, adult ([place]; Lactose intolerance; Other allergic rhinitis; Obesity (BMI 30-39.9); Encounter for supervision of normal pregnancy, antepartum; GBS bacteriuria; History of COVID-19; and Chlamydia infection affecting pregnancy on their problem list. ? ?Patient reports general discomforts of pregnancy.  Contractions: Irritability. Vag. Bleeding: None.  Movement: Present. Denies leaking of fluid.  ? ?The following portions of the patient's history were reviewed and updated as appropriate: allergies, current medications, past family history, past medical history, past social history, past surgical history and problem list. Problem list updated. ? ?Objective:  ? ?Vitals:  ? 12/19/21 1427  ?BP: 127/80  ?Pulse: 90  ?Weight: 243 lb 12.8 oz (110.6 kg)  ? ? ?Fetal Status:     Movement: Present  Presentation: Vertex ? ?General:  Alert, oriented and cooperative. Patient is in no acute distress.  ?Skin: Skin is warm and dry. No rash noted.   ?Cardiovascular: Normal heart rate noted  ?Respiratory: Normal respiratory effort, no problems with respiration noted  ?Abdomen: Soft, gravid, appropriate for gestational age. Pain/Pressure: Present     ?Pelvic:  Cervical exam performed Dilation: Closed      ?Extremities: Normal range of motion.  Edema: None  ?Mental Status: Normal mood and affect. Normal behavior. Normal judgment and thought content.  ? ?Urinalysis:     ? ?Assessment and Plan:  ?Pregnancy: G2P1001 at 378w1d ?1. Supervision of other normal pregnancy, antepartum ?Labor precautions ? ?2. GBS bacteriuria ?Tx while in labor ? ?3. Chlamydia infection affecting pregnancy in third trimester ?TOC today ? ?Term labor symptoms and general obstetric precautions including but not limited to vaginal bleeding, contractions, leaking of  fluid and fetal movement were reviewed in detail with the patient. ?Please refer to After Visit Summary for other counseling recommendations.  ?Return in about 1 week (around 12/26/2021) for OB visit, face to face, any provider. ? ? ?ErChancy MilroyMD ?

## 2021-12-19 NOTE — Patient Instructions (Signed)
Vaginal Delivery  Vaginal delivery means that you give birth by pushing your baby out of your birth canal (vagina). Your health care team will help you before, during, and after vaginal delivery. Birth experiences are unique for every woman and every pregnancy, and birth experiences vary depending on where you choose to give birth. What are the risks and benefits? Generally, this is safe. However, problems may occur, including: Bleeding. Infection. Damage to other structures such as vaginal tearing. Allergic reactions to medicines. Despite the risks, benefits of vaginal delivery include less risk of bleeding and infection and a shorter recovery time compared to a Cesarean delivery. Cesarean delivery, or C-section, is the surgical delivery of a baby. What happens when I arrive at the birth center or hospital? Once you are in labor and have been admitted into the hospital or birth center, your health care team may: Review your pregnancy history and any concerns that you have. Talk with you about your birth plan and discuss pain control options. Check your blood pressure, breathing, and heartbeat. Assess your baby's heartbeat. Monitor your uterus for contractions. Check whether your bag of water (amniotic sac) has broken (ruptured). Insert an IV into one of your veins. This may be used to give you fluids and medicines. Monitoring Your health care team may assess your contractions (uterine monitoring) and your baby's heart rate (fetal monitoring). You may need to be monitored: Often, but not continuously (intermittently). All the time or for long periods at a time (continuously). Continuous monitoring may be needed if: You are taking certain medicines, such as medicine to relieve pain or make your contractions stronger. You have pregnancy or labor complications. Monitoring may be done by: Placing a special stethoscope or a handheld monitoring device on your abdomen to check your baby's  heartbeat and to check for contractions. Placing monitors on your abdomen (external monitors) to record your baby's heartbeat and the frequency and length of contractions. Placing monitors inside your uterus through your vagina (internal monitors) to record your baby's heartbeat and the frequency, length, and strength of your contractions. Depending on the type of monitor, it may remain in your uterus or on your baby's head until birth. Telemetry. This is a type of continuous monitoring that can be done with external or internal monitors. Instead of having to stay in bed, you are able to move around. Physical exam Your health care team may perform frequent physical exams. This may include: Checking how and where your baby is positioned in your uterus. Checking your cervix to determine: Whether it is thinning out (effacing). Whether it is opening up (dilating). What happens during labor and delivery?  Normal labor and delivery is divided into the following three stages: Stage 1 This is the longest stage of labor. Throughout this stage, you will feel contractions. Contractions generally feel mild, infrequent, and irregular at first. They get stronger, more frequent, and more regular as you move through this stage. You may have contractions about every 2-3 minutes. This stage ends when your cervix is completely dilated to 4 inches (10 cm) and completely effaced. Stage 2 This stage starts once your cervix is completely effaced and dilated and lasts until the delivery of your baby. This is the stage where you will feel an urge to push your baby out of your vagina. You may feel stretching and burning pain, especially when the widest part of your baby's head passes through the vaginal opening (crowning). Once your baby is delivered, the umbilical cord will be   clamped and cut. Timing of cutting the cord will depend on your wishes, your baby's health, and your health care provider's practices. Your baby  will be placed on your bare chest (skin-to-skin contact) in an upright position and covered with a warm blanket. If you are choosing to breastfeed, watch your baby for feeding cues, like rooting or sucking, and help the baby to your breast for his or her first feeding. Stage 3 This stage starts immediately after the birth of your baby and ends after you deliver the placenta. This stage may take anywhere from 5 to 30 minutes. After your baby has been delivered, you will feel contractions as your body expels the placenta. These contractions also help your uterus get smaller and reduce bleeding. What can I expect after labor and delivery? After labor is over, you and your baby will be assessed closely until you are ready to go home. Your health care team will teach you how to care for yourself and your baby. You and your baby may be encouraged to stay in the same room (rooming in) during your hospital stay. This will help promote early bonding and successful breastfeeding. Your uterus will be checked and massaged regularly (fundal massage). You may continue to receive fluids and medicines through an IV. You will have some soreness and pain in your abdomen, vagina, and the area of skin between your vaginal opening and your anus (perineum). If an incision was made near your vagina (episiotomy) or if you had some vaginal tearing during delivery, cold compresses may be placed on your episiotomy or your tear. This helps to reduce pain and swelling. It is normal to have vaginal bleeding after delivery. Wear a sanitary pad for vaginal bleeding and discharge. Summary Vaginal delivery means that you will give birth by pushing your baby out of your birth canal (vagina). Your health care team will monitor you and your baby throughout the stages of labor. After you deliver your baby, your health care team will continue to assess you and your baby to ensure you are both recovering as expected after delivery. This  information is not intended to replace advice given to you by your health care provider. Make sure you discuss any questions you have with your health care provider. Document Revised: 07/03/2020 Document Reviewed: 07/03/2020 Elsevier Patient Education  2023 [employer]  

## 2021-12-19 NOTE — Progress Notes (Signed)
ROB pt in office for routine visit. Pt c/o contractions today and is requesting cervix check and TOC for Chlamydia.  ?

## 2021-12-19 NOTE — Addendum Note (Signed)
Addended by: Tristan Schroeder D on: 12/19/2021 04:47 PM ? ? Modules accepted: Orders ? ?

## 2021-12-21 LAB — CERVICOVAGINAL ANCILLARY ONLY
Chlamydia: NEGATIVE
Comment: NEGATIVE
Comment: NORMAL
Neisseria Gonorrhea: NEGATIVE

## 2021-12-22 ENCOUNTER — Inpatient Hospital Stay (HOSPITAL_COMMUNITY)
Admission: AD | Admit: 2021-12-22 | Discharge: 2021-12-22 | Disposition: A | Discharge status: Home / Self Care | Payer: Medicaid Other | Attending: Obstetrics & Gynecology | Admitting: Obstetrics & Gynecology

## 2021-12-22 ENCOUNTER — Encounter: Payer: Self-pay | Admitting: Obstetrics

## 2021-12-22 ENCOUNTER — Other Ambulatory Visit: Payer: Self-pay

## 2021-12-22 ENCOUNTER — Encounter (HOSPITAL_COMMUNITY): Payer: Self-pay | Admitting: Obstetrics & Gynecology

## 2021-12-22 DIAGNOSIS — B3731 Acute candidiasis of vulva and vagina: Secondary | ICD-10-CM

## 2021-12-22 DIAGNOSIS — O26893 Other specified pregnancy related conditions, third trimester: Secondary | ICD-10-CM | POA: Diagnosis not present

## 2021-12-22 DIAGNOSIS — O98819 Other maternal infectious and parasitic diseases complicating pregnancy, unspecified trimester: Secondary | ICD-10-CM | POA: Diagnosis not present

## 2021-12-22 DIAGNOSIS — O23593 Infection of other part of genital tract in pregnancy, third trimester: Secondary | ICD-10-CM | POA: Insufficient documentation

## 2021-12-22 DIAGNOSIS — Z3A38 38 weeks gestation of pregnancy: Secondary | ICD-10-CM | POA: Diagnosis not present

## 2021-12-22 DIAGNOSIS — O98813 Other maternal infectious and parasitic diseases complicating pregnancy, third trimester: Secondary | ICD-10-CM | POA: Insufficient documentation

## 2021-12-22 DIAGNOSIS — M7989 Other specified soft tissue disorders: Secondary | ICD-10-CM | POA: Insufficient documentation

## 2021-12-22 DIAGNOSIS — M79673 Pain in unspecified foot: Secondary | ICD-10-CM | POA: Diagnosis not present

## 2021-12-22 LAB — URINALYSIS, ROUTINE W REFLEX MICROSCOPIC
Bilirubin Urine: NEGATIVE
Glucose, UA: NEGATIVE mg/dL
Hgb urine dipstick: NEGATIVE
Ketones, ur: NEGATIVE mg/dL
Nitrite: NEGATIVE
Protein, ur: NEGATIVE mg/dL
Specific Gravity, Urine: 1.013 (ref 1.005–1.030)
pH: 7 (ref 5.0–8.0)

## 2021-12-22 LAB — WET PREP, GENITAL
Clue Cells Wet Prep HPF POC: NONE SEEN
Sperm: NONE SEEN
Trich, Wet Prep: NONE SEEN
WBC, Wet Prep HPF POC: >=10 — AB (ref <10)

## 2021-12-22 MED ORDER — TERCONAZOLE 0.4 % VA CREA: 1.0000 | TOPICAL_CREAM | Freq: Every day | VAGINAL | 0 refills | Status: DC

## 2021-12-22 NOTE — Discharge Instructions (Signed)

## 2021-12-22 NOTE — MAU Note (Signed)
Briana Curry is a 21 y.o. at 51w4dhere in MAU reporting: bilateral swelling in her feet since last night. Also thinks she has a yeast infection. Feels like she is a little bit dizzy and like it is hard for her to catch her breath. No other pain besides her feet, no bleeding or LOF. +FM. ? ?Onset of complaint: ongoing ? ?Pain score: 5/10 ? ?Vitals:  ? 12/22/21 1314  ?BP: (!) 106/59  ?Pulse: (!) 109  ?Resp: 16  ?Temp: 99.3 ?F (37.4 ?C)  ?SpO2: 98%  ?   ?FHT:150 ? ?Lab orders placed from triage: UA ? ?

## 2021-12-22 NOTE — MAU Provider Note (Signed)
?History  ?  ? ?CSN: [id number] ? ?Arrival date and time: 12/22/21 1255 ? ? Event Date/Time  ? First Provider Initiated Contact with Patient 12/22/21 1346   ?  ? ?Chief Complaint  ?Patient presents with  ? Foot Pain  ? Shortness of Breath  ? ?HPI ?Briana Curry is a 21 y.o. G2P1001 at 65w4dwho presents with swelling. She reports earlier today her feet were swollen and she was worried about preeclampsia so she came in. She reports the swelling has since resolved. She denies any headache, visual changes or epigastric pain. She denies any hx of hypertension. She reported to the nurse shortness of breath but when questioned by CNM, patient reports the sensation that her bra is on too tight and baby is in her ribs. She denies any abdominal pain, vaginal bleeding. She reports feeling like she has a yeast infection. She reports normal fetal movement. She is requesting to talk about IOL ? ?OB History   ? ? Gravida  ?2  ? Para  ?1  ? Term  ?1  ? Preterm  ?   ? AB  ?   ? Living  ?1  ?  ? ? SAB  ?   ? IAB  ?   ? Ectopic  ?   ? Multiple  ?0  ? Live Births  ?1  ?   ?  ?  ? ? ?Past Medical History:  ?Diagnosis Date  ? Anemia   ? Asthma   ? Broken arm   ? Iron deficiency   ? Multiple allergies   ? ? ?Past Surgical History:  ?Procedure Laterality Date  ? ARM HARDWARE REMOVAL    ? ? ?Family History  ?Problem Relation Age of Onset  ? Asthma Mother   ? Asthma Sister   ? Eczema Sister   ? Urticaria Sister   ? Asthma Brother   ? Eczema Brother   ? ? ?Social History  ? ?Tobacco Use  ? Smoking status: Former  ?  Types: Cigars  ?  Quit date: 03/2021  ?  Years since quitting: 0.7  ? Smokeless tobacco: Never  ?Vaping Use  ? Vaping Use: Never used  ?Substance Use Topics  ? Alcohol use: Not Currently  ?  Comment: not while preg  ? Drug use: No  ? ? ?Allergies:  ?Allergies  ?Allergen Reactions  ? Shrimp [Shellfish Allergy] Anaphylaxis  ?  Pt states she is also allergic to roaches   ? Lactose Intolerance (Gi) Diarrhea  ? Other Rash  ?  [employer]  sweet and sour sauce caused rash and seafood  ? ? ?Medications Prior to Admission  ?Medication Sig Dispense Refill Last Dose  ? metroNIDAZOLE (METROGEL VAGINAL) 0.75 % vaginal gel Place 1 Applicatorful vaginally at bedtime. 70 g 0   ? Misc. Devices MISC Dispense one maternity belt for patient 1 each 0   ? Prenatal Vit-Fe Fumarate-FA (PRENATAL PLUS VITAMIN/MINERAL) 27-1 MG TABS Take 1 tablet by mouth daily. 90 tablet 2   ? ? ?Review of Systems  ?Constitutional: Negative.  Negative for fatigue and fever.  ?HENT: Negative.    ?Respiratory: Negative.  Negative for shortness of breath.   ?Cardiovascular: Negative.  Negative for chest pain.  ?Gastrointestinal: Negative.  Negative for abdominal pain, constipation, diarrhea, nausea and vomiting.  ?Genitourinary:  Positive for vaginal discharge. Negative for dysuria and vaginal bleeding.  ?Neurological: Negative.  Negative for dizziness and headaches.  ?Physical Exam  ? ?Blood pressure (!) 106/59, pulse (Marland Kitchen  109, temperature 99.3 ?F (37.4 ?C), temperature source Oral, resp. rate 16, height '5\' 3"'$  (1.6 m), weight 110.9 kg, last menstrual period 03/27/2021, SpO2 98 %, not currently breastfeeding. ? ?Physical Exam ?Vitals and nursing note reviewed.  ?Constitutional:   ?   General: She is not in acute distress. ?   Appearance: She is well-developed.  ?HENT:  ?   Head: Normocephalic.  ?Eyes:  ?   Pupils: Pupils are equal, round, and reactive to light.  ?Cardiovascular:  ?   Rate and Rhythm: Normal rate and regular rhythm.  ?   Heart sounds: Normal heart sounds.  ?Pulmonary:  ?   Effort: Pulmonary effort is normal. No respiratory distress.  ?   Breath sounds: Normal breath sounds.  ?Abdominal:  ?   General: Bowel sounds are normal. There is no distension.  ?   Palpations: Abdomen is soft.  ?   Tenderness: There is no abdominal tenderness.  ?Skin: ?   General: Skin is warm and dry.  ?Neurological:  ?   Mental Status: She is alert and oriented to person, place, and time.   ?Psychiatric:     ?   Mood and Affect: Mood normal.     ?   Behavior: Behavior normal.     ?   Thought Content: Thought content normal.     ?   Judgment: Judgment normal.  ? ? ?MAU Course  ?Procedures ?Results for orders placed or performed during the hospital encounter of 12/22/21 (from the past 24 hour(s))  ?Urinalysis, Routine w reflex microscopic Urine, Clean Catch     Status: Abnormal  ? Collection Time: 12/22/21  1:13 PM  ?Result Value Ref Range  ? Color, Urine YELLOW YELLOW  ? APPearance CLEAR CLEAR  ? Specific Gravity, Urine 1.013 1.005 - 1.030  ? pH 7.0 5.0 - 8.0  ? Glucose, UA NEGATIVE NEGATIVE mg/dL  ? Hgb urine dipstick NEGATIVE NEGATIVE  ? Bilirubin Urine NEGATIVE NEGATIVE  ? Ketones, ur NEGATIVE NEGATIVE mg/dL  ? Protein, ur NEGATIVE NEGATIVE mg/dL  ? Nitrite NEGATIVE NEGATIVE  ? Leukocytes,Ua TRACE (A) NEGATIVE  ? RBC / HPF 0-5 0 - 5 RBC/hpf  ? WBC, UA 11-20 0 - 5 WBC/hpf  ? Bacteria, UA RARE (A) NONE SEEN  ? Squamous Epithelial / LPF 0-5 0 - 5  ? Mucus PRESENT   ?Wet prep, genital     Status: Abnormal  ? Collection Time: 12/22/21  2:00 PM  ?Result Value Ref Range  ? Yeast Wet Prep HPF POC PRESENT (A) NONE SEEN  ? Trich, Wet Prep NONE SEEN NONE SEEN  ? Clue Cells Wet Prep HPF POC NONE SEEN NONE SEEN  ? WBC, Wet Prep HPF POC >=10 (A) <10  ? Sperm NONE SEEN   ?  ?MDM ?Reassurance provided of normalcy of BP today and diagnostic criteria for preeclampsia reviewed at length. No lower extremity swelling noted. ?Wet prep and gc/chlamydia ? ?Lengthy discussion about elective IOL. After discussing options and risks/benefits, patient declines. She states she will talk to her OB about it at her appointment this week if she changes her mind.  ? ?Assessment and Plan  ? ?1. Candidiasis of vagina during pregnancy   ?2. [redacted] weeks gestation of pregnancy   ? ?-Discharge home in stable condition ?-Rx for terazol sent to patient's pharmacy ?-Labor precautions discussed ?-Patient advised to follow-up with OB as scheduled  for prenatal care ?-Patient may return to MAU as needed or if her condition were to change  or worsen ? ?Wende Mott CNM ?12/22/2021, 1:46 PM  ?

## 2021-12-24 LAB — GC/CHLAMYDIA PROBE AMP (~~LOC~~) NOT AT ARMC
Chlamydia: NEGATIVE
Comment: NEGATIVE
Comment: NORMAL
Neisseria Gonorrhea: NEGATIVE

## 2021-12-27 ENCOUNTER — Encounter: Payer: Self-pay | Admitting: Obstetrics and Gynecology

## 2021-12-27 ENCOUNTER — Encounter: Payer: Medicaid Other | Admitting: Obstetrics and Gynecology

## 2021-12-27 ENCOUNTER — Ambulatory Visit (INDEPENDENT_AMBULATORY_CARE_PROVIDER_SITE_OTHER): Payer: Medicaid Other | Admitting: Obstetrics and Gynecology

## 2021-12-27 VITALS — BP 110/74 | HR 96 | Wt 246.0 lb

## 2021-12-27 DIAGNOSIS — R8271 Bacteriuria: Secondary | ICD-10-CM

## 2021-12-27 DIAGNOSIS — Z3480 Encounter for supervision of other normal pregnancy, unspecified trimester: Secondary | ICD-10-CM

## 2021-12-27 NOTE — Progress Notes (Signed)
Pt states increase in pelvic pain, worsening over the last 3 days.  ?Pt states she is still having some discharge.  ?Recently used Terazol.  ? ?

## 2021-12-27 NOTE — Patient Instructions (Signed)
Vaginal Delivery  Vaginal delivery means that you give birth by pushing your baby out of your birth canal (vagina). Your health care team will help you before, during, and after vaginal delivery. Birth experiences are unique for every woman and every pregnancy, and birth experiences vary depending on where you choose to give birth. What are the risks and benefits? Generally, this is safe. However, problems may occur, including: Bleeding. Infection. Damage to other structures such as vaginal tearing. Allergic reactions to medicines. Despite the risks, benefits of vaginal delivery include less risk of bleeding and infection and a shorter recovery time compared to a Cesarean delivery. Cesarean delivery, or C-section, is the surgical delivery of a baby. What happens when I arrive at the birth center or hospital? Once you are in labor and have been admitted into the hospital or birth center, your health care team may: Review your pregnancy history and any concerns that you have. Talk with you about your birth plan and discuss pain control options. Check your blood pressure, breathing, and heartbeat. Assess your baby's heartbeat. Monitor your uterus for contractions. Check whether your bag of water (amniotic sac) has broken (ruptured). Insert an IV into one of your veins. This may be used to give you fluids and medicines. Monitoring Your health care team may assess your contractions (uterine monitoring) and your baby's heart rate (fetal monitoring). You may need to be monitored: Often, but not continuously (intermittently). All the time or for long periods at a time (continuously). Continuous monitoring may be needed if: You are taking certain medicines, such as medicine to relieve pain or make your contractions stronger. You have pregnancy or labor complications. Monitoring may be done by: Placing a special stethoscope or a handheld monitoring device on your abdomen to check your baby's  heartbeat and to check for contractions. Placing monitors on your abdomen (external monitors) to record your baby's heartbeat and the frequency and length of contractions. Placing monitors inside your uterus through your vagina (internal monitors) to record your baby's heartbeat and the frequency, length, and strength of your contractions. Depending on the type of monitor, it may remain in your uterus or on your baby's head until birth. Telemetry. This is a type of continuous monitoring that can be done with external or internal monitors. Instead of having to stay in bed, you are able to move around. Physical exam Your health care team may perform frequent physical exams. This may include: Checking how and where your baby is positioned in your uterus. Checking your cervix to determine: Whether it is thinning out (effacing). Whether it is opening up (dilating). What happens during labor and delivery?  Normal labor and delivery is divided into the following three stages: Stage 1 This is the longest stage of labor. Throughout this stage, you will feel contractions. Contractions generally feel mild, infrequent, and irregular at first. They get stronger, more frequent, and more regular as you move through this stage. You may have contractions about every 2-3 minutes. This stage ends when your cervix is completely dilated to 4 inches (10 cm) and completely effaced. Stage 2 This stage starts once your cervix is completely effaced and dilated and lasts until the delivery of your baby. This is the stage where you will feel an urge to push your baby out of your vagina. You may feel stretching and burning pain, especially when the widest part of your baby's head passes through the vaginal opening (crowning). Once your baby is delivered, the umbilical cord will be   clamped and cut. Timing of cutting the cord will depend on your wishes, your baby's health, and your health care provider's practices. Your baby  will be placed on your bare chest (skin-to-skin contact) in an upright position and covered with a warm blanket. If you are choosing to breastfeed, watch your baby for feeding cues, like rooting or sucking, and help the baby to your breast for his or her first feeding. Stage 3 This stage starts immediately after the birth of your baby and ends after you deliver the placenta. This stage may take anywhere from 5 to 30 minutes. After your baby has been delivered, you will feel contractions as your body expels the placenta. These contractions also help your uterus get smaller and reduce bleeding. What can I expect after labor and delivery? After labor is over, you and your baby will be assessed closely until you are ready to go home. Your health care team will teach you how to care for yourself and your baby. You and your baby may be encouraged to stay in the same room (rooming in) during your hospital stay. This will help promote early bonding and successful breastfeeding. Your uterus will be checked and massaged regularly (fundal massage). You may continue to receive fluids and medicines through an IV. You will have some soreness and pain in your abdomen, vagina, and the area of skin between your vaginal opening and your anus (perineum). If an incision was made near your vagina (episiotomy) or if you had some vaginal tearing during delivery, cold compresses may be placed on your episiotomy or your tear. This helps to reduce pain and swelling. It is normal to have vaginal bleeding after delivery. Wear a sanitary pad for vaginal bleeding and discharge. Summary Vaginal delivery means that you will give birth by pushing your baby out of your birth canal (vagina). Your health care team will monitor you and your baby throughout the stages of labor. After you deliver your baby, your health care team will continue to assess you and your baby to ensure you are both recovering as expected after delivery. This  information is not intended to replace advice given to you by your health care provider. Make sure you discuss any questions you have with your health care provider. Document Revised: 07/03/2020 Document Reviewed: 07/03/2020 Elsevier Patient Education  2023 [employer]  

## 2021-12-27 NOTE — Progress Notes (Signed)
Subjective:  ?BRECKEN Curry is a 21 y.o. G2P1001 at 54w2dbeing seen today for ongoing prenatal care.  She is currently monitored for the following issues for this low-risk pregnancy and has BMI 40.0-44.9, adult ([place]; Lactose intolerance; Other allergic rhinitis; Obesity (BMI 30-39.9); Encounter for supervision of normal pregnancy, antepartum; GBS bacteriuria; History of COVID-19; and Chlamydia infection affecting pregnancy on their problem list. ? ?Patient reports general discomforts of pregnancy.  Contractions: Irregular. Vag. Bleeding: None.  Movement: Present. Denies leaking of fluid.  ? ?The following portions of the patient's history were reviewed and updated as appropriate: allergies, current medications, past family history, past medical history, past social history, past surgical history and problem list. Problem list updated. ? ?Objective:  ? ?Vitals:  ? 12/27/21 1518  ?BP: 110/74  ?Pulse: 96  ?Weight: 246 lb (111.6 kg)  ? ? ?Fetal Status: Fetal Heart Rate (bpm): 135   Movement: Present    ? ?General:  Alert, oriented and cooperative. Patient is in no acute distress.  ?Skin: Skin is warm and dry. No rash noted.   ?Cardiovascular: Normal heart rate noted  ?Respiratory: Normal respiratory effort, no problems with respiration noted  ?Abdomen: Soft, gravid, appropriate for gestational age. Pain/Pressure: Present     ?Pelvic:  Cervical exam performed        ?Extremities: Normal range of motion.     ?Mental Status: Normal mood and affect. Normal behavior. Normal judgment and thought content.  ? ?Urinalysis:     ? ?Assessment and Plan:  ?Pregnancy: G2P1001 at 363w2d ?1. Supervision of other normal pregnancy, antepartum ?Stable ?Labor precautions ?IOL scheduled [facility] ? ?2. GBS bacteriuria ?Tx while in labor ? ?FE[place]news Bayapers dropped off at front desk. ?Pt advised she may start her her [place] any time but unable to take her out of work without a medical reason ?Term labor symptoms and general  obstetric precautions including but not limited to vaginal bleeding, contractions, leaking of fluid and fetal movement were reviewed in detail with the patient. ?Please refer to After Visit Summary for other counseling recommendations.  ?Return in about 1 week (around 01/03/2022) for OB visit, face to face, any provider, needs NST. ? ? ?ErChancy MilroyMD ?

## 2021-12-28 ENCOUNTER — Telehealth (HOSPITAL_COMMUNITY): Payer: Self-pay | Admitting: *Deleted

## 2021-12-28 ENCOUNTER — Encounter (HOSPITAL_COMMUNITY): Payer: Self-pay | Admitting: *Deleted

## 2021-12-28 NOTE — Telephone Encounter (Signed)
Preadmission screen  

## 2022-01-03 ENCOUNTER — Encounter: Payer: Self-pay | Admitting: Obstetrics and Gynecology

## 2022-01-03 ENCOUNTER — Other Ambulatory Visit (HOSPITAL_COMMUNITY)
Admission: RE | Admit: 2022-01-03 | Discharge: 2022-01-03 | Disposition: A | Discharge status: Home / Self Care | Payer: Medicaid Other | Source: Ambulatory Visit | Attending: Obstetrics and Gynecology | Admitting: Obstetrics and Gynecology

## 2022-01-03 ENCOUNTER — Ambulatory Visit (INDEPENDENT_AMBULATORY_CARE_PROVIDER_SITE_OTHER): Payer: Medicaid Other | Admitting: Obstetrics and Gynecology

## 2022-01-03 ENCOUNTER — Other Ambulatory Visit: Payer: Self-pay | Admitting: Advanced Practice Midwife

## 2022-01-03 VITALS — BP 115/75 | HR 90 | Wt 247.0 lb

## 2022-01-03 DIAGNOSIS — Z3480 Encounter for supervision of other normal pregnancy, unspecified trimester: Secondary | ICD-10-CM | POA: Diagnosis present

## 2022-01-03 DIAGNOSIS — R8271 Bacteriuria: Secondary | ICD-10-CM

## 2022-01-03 NOTE — Progress Notes (Signed)
Subjective:  Briana Curry is a 21 y.o. G2P1001 at 75w2dbeing seen today for ongoing prenatal care.  She is currently monitored for the following issues for this low-risk pregnancy and has BMI 40.0-44.9, adult ([place]; Lactose intolerance; Other allergic rhinitis; Obesity (BMI 30-39.9); Encounter for supervision of normal pregnancy, antepartum; GBS bacteriuria; History of COVID-19; and Chlamydia infection affecting pregnancy on their problem list.  Patient reports general discomforts of pregnancy.  Contractions: Irregular. Vag. Bleeding: None.  Movement: Present. Denies leaking of fluid.   The following portions of the patient's history were reviewed and updated as appropriate: allergies, current medications, past family history, past medical history, past social history, past surgical history and problem list. Problem list updated.  Objective:   Vitals:   01/03/22 1319  BP: 115/75  Pulse: 90  Weight: 247 lb (112 kg)    Fetal Status: Fetal Heart Rate (bpm): NST   Movement: Present     General:  Alert, oriented and cooperative. Patient is in no acute distress.  Skin: Skin is warm and dry. No rash noted.   Cardiovascular: Normal heart rate noted  Respiratory: Normal respiratory effort, no problems with respiration noted  Abdomen: Soft, gravid, appropriate for gestational age. Pain/Pressure: Present     Pelvic:  Cervical exam performed        Extremities: Normal range of motion.  Edema: Trace  Mental Status: Normal mood and affect. Normal behavior. Normal judgment and thought content.   Urinalysis:      Assessment and Plan:  Pregnancy: G2P1001 at 463w2d1. Supervision of other normal pregnancy, antepartum Stable IOL scheduled  NST Baseline 120-130's, 10 x 10, one variable, no ut ctx, reactive  2. GBS bacteriuria Tx while in labor  Term labor symptoms and general obstetric precautions including but not limited to vaginal bleeding, contractions, leaking of fluid and fetal  movement were reviewed in detail with the patient. Please refer to After Visit Summary for other counseling recommendations.  No follow-ups on file.   ErChancy MilroyMD

## 2022-01-03 NOTE — Patient Instructions (Signed)
Vaginal Delivery  Vaginal delivery means that you give birth by pushing your baby out of your birth canal (vagina). Your health care team will help you before, during, and after vaginal delivery. Birth experiences are unique for every woman and every pregnancy, and birth experiences vary depending on where you choose to give birth. What are the risks and benefits? Generally, this is safe. However, problems may occur, including: Bleeding. Infection. Damage to other structures such as vaginal tearing. Allergic reactions to medicines. Despite the risks, benefits of vaginal delivery include less risk of bleeding and infection and a shorter recovery time compared to a Cesarean delivery. Cesarean delivery, or C-section, is the surgical delivery of a baby. What happens when I arrive at the birth center or hospital? Once you are in labor and have been admitted into the hospital or birth center, your health care team may: Review your pregnancy history and any concerns that you have. Talk with you about your birth plan and discuss pain control options. Check your blood pressure, breathing, and heartbeat. Assess your baby's heartbeat. Monitor your uterus for contractions. Check whether your bag of water (amniotic sac) has broken (ruptured). Insert an IV into one of your veins. This may be used to give you fluids and medicines. Monitoring Your health care team may assess your contractions (uterine monitoring) and your baby's heart rate (fetal monitoring). You may need to be monitored: Often, but not continuously (intermittently). All the time or for long periods at a time (continuously). Continuous monitoring may be needed if: You are taking certain medicines, such as medicine to relieve pain or make your contractions stronger. You have pregnancy or labor complications. Monitoring may be done by: Placing a special stethoscope or a handheld monitoring device on your abdomen to check your baby's  heartbeat and to check for contractions. Placing monitors on your abdomen (external monitors) to record your baby's heartbeat and the frequency and length of contractions. Placing monitors inside your uterus through your vagina (internal monitors) to record your baby's heartbeat and the frequency, length, and strength of your contractions. Depending on the type of monitor, it may remain in your uterus or on your baby's head until birth. Telemetry. This is a type of continuous monitoring that can be done with external or internal monitors. Instead of having to stay in bed, you are able to move around. Physical exam Your health care team may perform frequent physical exams. This may include: Checking how and where your baby is positioned in your uterus. Checking your cervix to determine: Whether it is thinning out (effacing). Whether it is opening up (dilating). What happens during labor and delivery?  Normal labor and delivery is divided into the following three stages: Stage 1 This is the longest stage of labor. Throughout this stage, you will feel contractions. Contractions generally feel mild, infrequent, and irregular at first. They get stronger, more frequent, and more regular as you move through this stage. You may have contractions about every 2-3 minutes. This stage ends when your cervix is completely dilated to 4 inches (10 cm) and completely effaced. Stage 2 This stage starts once your cervix is completely effaced and dilated and lasts until the delivery of your baby. This is the stage where you will feel an urge to push your baby out of your vagina. You may feel stretching and burning pain, especially when the widest part of your baby's head passes through the vaginal opening (crowning). Once your baby is delivered, the umbilical cord will be   clamped and cut. Timing of cutting the cord will depend on your wishes, your baby's health, and your health care provider's practices. Your baby  will be placed on your bare chest (skin-to-skin contact) in an upright position and covered with a warm blanket. If you are choosing to breastfeed, watch your baby for feeding cues, like rooting or sucking, and help the baby to your breast for his or her first feeding. Stage 3 This stage starts immediately after the birth of your baby and ends after you deliver the placenta. This stage may take anywhere from 5 to 30 minutes. After your baby has been delivered, you will feel contractions as your body expels the placenta. These contractions also help your uterus get smaller and reduce bleeding. What can I expect after labor and delivery? After labor is over, you and your baby will be assessed closely until you are ready to go home. Your health care team will teach you how to care for yourself and your baby. You and your baby may be encouraged to stay in the same room (rooming in) during your hospital stay. This will help promote early bonding and successful breastfeeding. Your uterus will be checked and massaged regularly (fundal massage). You may continue to receive fluids and medicines through an IV. You will have some soreness and pain in your abdomen, vagina, and the area of skin between your vaginal opening and your anus (perineum). If an incision was made near your vagina (episiotomy) or if you had some vaginal tearing during delivery, cold compresses may be placed on your episiotomy or your tear. This helps to reduce pain and swelling. It is normal to have vaginal bleeding after delivery. Wear a sanitary pad for vaginal bleeding and discharge. Summary Vaginal delivery means that you will give birth by pushing your baby out of your birth canal (vagina). Your health care team will monitor you and your baby throughout the stages of labor. After you deliver your baby, your health care team will continue to assess you and your baby to ensure you are both recovering as expected after delivery. This  information is not intended to replace advice given to you by your health care provider. Make sure you discuss any questions you have with your health care provider. Document Revised: 07/03/2020 Document Reviewed: 07/03/2020 Elsevier Patient Education  2023 [employer]  

## 2022-01-03 NOTE — Progress Notes (Signed)
Post dates OB, placed on NST today.

## 2022-01-04 ENCOUNTER — Encounter: Payer: Self-pay | Admitting: Obstetrics and Gynecology

## 2022-01-04 LAB — CERVICOVAGINAL ANCILLARY ONLY
Bacterial Vaginitis (gardnerella): NEGATIVE
Chlamydia: NEGATIVE
Comment: NEGATIVE
Comment: NEGATIVE
Comment: NEGATIVE
Comment: NORMAL
Neisseria Gonorrhea: NEGATIVE
Trichomonas: NEGATIVE

## 2022-01-07 ENCOUNTER — Other Ambulatory Visit: Payer: Self-pay | Admitting: *Deleted

## 2022-01-07 ENCOUNTER — Other Ambulatory Visit (HOSPITAL_COMMUNITY)
Admission: RE | Admit: 2022-01-07 | Discharge: 2022-01-07 | Disposition: A | Discharge status: Home / Self Care | Payer: Medicaid Other | Source: Ambulatory Visit | Attending: Obstetrics and Gynecology | Admitting: Obstetrics and Gynecology

## 2022-01-07 DIAGNOSIS — N898 Other specified noninflammatory disorders of vagina: Secondary | ICD-10-CM

## 2022-01-07 DIAGNOSIS — Z3480 Encounter for supervision of other normal pregnancy, unspecified trimester: Secondary | ICD-10-CM | POA: Insufficient documentation

## 2022-01-07 NOTE — Progress Notes (Signed)
Add on lab order for yeast to swab that was collected on 01/03/22. Order faxed to lab, will wait for results to treat.

## 2022-01-08 ENCOUNTER — Inpatient Hospital Stay (HOSPITAL_COMMUNITY): Payer: Medicaid Other | Admitting: Anesthesiology

## 2022-01-08 ENCOUNTER — Inpatient Hospital Stay (HOSPITAL_COMMUNITY)
Admission: AD | Admit: 2022-01-08 | Discharge: 2022-01-10 | DRG: 807 | Disposition: A | Discharge status: Home / Self Care | Payer: Medicaid Other | Attending: Obstetrics & Gynecology | Admitting: Obstetrics & Gynecology

## 2022-01-08 ENCOUNTER — Other Ambulatory Visit: Payer: Self-pay

## 2022-01-08 ENCOUNTER — Inpatient Hospital Stay (HOSPITAL_COMMUNITY): Payer: Medicaid Other

## 2022-01-08 ENCOUNTER — Encounter (HOSPITAL_COMMUNITY): Payer: Self-pay | Admitting: Obstetrics and Gynecology

## 2022-01-08 DIAGNOSIS — O480 Post-term pregnancy: Principal | ICD-10-CM | POA: Diagnosis present

## 2022-01-08 DIAGNOSIS — Z3A41 41 weeks gestation of pregnancy: Secondary | ICD-10-CM

## 2022-01-08 DIAGNOSIS — O99214 Obesity complicating childbirth: Secondary | ICD-10-CM | POA: Diagnosis present

## 2022-01-08 DIAGNOSIS — Z8616 Personal history of COVID-19: Secondary | ICD-10-CM

## 2022-01-08 DIAGNOSIS — O99824 Streptococcus B carrier state complicating childbirth: Secondary | ICD-10-CM | POA: Diagnosis present

## 2022-01-08 DIAGNOSIS — Z87891 Personal history of nicotine dependence: Secondary | ICD-10-CM | POA: Diagnosis not present

## 2022-01-08 DIAGNOSIS — O99820 Streptococcus B carrier state complicating pregnancy: Secondary | ICD-10-CM | POA: Diagnosis not present

## 2022-01-08 DIAGNOSIS — Z3480 Encounter for supervision of other normal pregnancy, unspecified trimester: Secondary | ICD-10-CM

## 2022-01-08 HISTORY — DX: Post-term pregnancy: O48.0

## 2022-01-08 LAB — TYPE AND SCREEN
ABO/RH(D): A POS
Antibody Screen: NEGATIVE

## 2022-01-08 LAB — CBC
HCT: 35.8 % — ABNORMAL LOW (ref 36.0–46.0)
Hemoglobin: 11.7 g/dL — ABNORMAL LOW (ref 12.0–15.0)
MCH: 29 pg (ref 26.0–34.0)
MCHC: 32.7 g/dL (ref 30.0–36.0)
MCV: 88.8 fL (ref 80.0–100.0)
Platelets: 227 10*3/uL (ref 150–400)
RBC: 4.03 MIL/uL (ref 3.87–5.11)
RDW: 13.9 % (ref 11.5–15.5)
WBC: 6.7 10*3/uL (ref 4.0–10.5)
nRBC: 0 % (ref 0.0–0.2)

## 2022-01-08 LAB — CERVICOVAGINAL ANCILLARY ONLY
Candida Glabrata: NEGATIVE
Candida Vaginitis: NEGATIVE
Comment: NEGATIVE
Comment: NEGATIVE

## 2022-01-08 MED ORDER — EPHEDRINE 5 MG/ML INJ: 10.0000 mg | INTRAVENOUS | Status: DC | PRN

## 2022-01-08 MED ORDER — SODIUM CHLORIDE 0.9 % IV SOLN
5.0000 10*6.[IU] | Freq: Once | INTRAVENOUS | Status: AC
  Administered 2022-01-08: 5 10*6.[IU] via INTRAVENOUS
  Filled 2022-01-08: qty 5

## 2022-01-08 MED ORDER — OXYTOCIN BOLUS FROM INFUSION
333.0000 mL | Freq: Once | INTRAVENOUS | Status: AC
  Administered 2022-01-09: 333 mL via INTRAVENOUS

## 2022-01-08 MED ORDER — FENTANYL-BUPIVACAINE-NACL 0.5-0.125-0.9 MG/250ML-% EP SOLN
12.0000 mL/h | EPIDURAL | Status: DC | PRN
  Administered 2022-01-08: 12 mL/h via EPIDURAL
  Filled 2022-01-08: qty 250

## 2022-01-08 MED ORDER — OXYCODONE-ACETAMINOPHEN 5-325 MG PO TABS: 2.0000 | ORAL_TABLET | ORAL | Status: DC | PRN

## 2022-01-08 MED ORDER — ACETAMINOPHEN 325 MG PO TABS
650.0000 mg | ORAL_TABLET | ORAL | Status: DC | PRN
Start: 2022-01-08 — End: 2022-01-09

## 2022-01-08 MED ORDER — ONDANSETRON HCL 4 MG/2ML IJ SOLN: 4.0000 mg | Freq: Four times a day (QID) | INTRAMUSCULAR | Status: DC | PRN

## 2022-01-08 MED ORDER — DIPHENHYDRAMINE HCL 50 MG/ML IJ SOLN: 12.5000 mg | INTRAMUSCULAR | Status: DC | PRN

## 2022-01-08 MED ORDER — PENICILLIN G POT IN DEXTROSE 60000 UNIT/ML IV SOLN
3.0000 10*6.[IU] | INTRAVENOUS | Status: DC
  Administered 2022-01-08 – 2022-01-09 (×3): 3 10*6.[IU] via INTRAVENOUS
  Filled 2022-01-08 (×3): qty 50

## 2022-01-08 MED ORDER — OXYCODONE-ACETAMINOPHEN 5-325 MG PO TABS: 1.0000 | ORAL_TABLET | ORAL | Status: DC | PRN

## 2022-01-08 MED ORDER — LACTATED RINGERS IV SOLN
500.0000 mL | Freq: Once | INTRAVENOUS | Status: AC
  Administered 2022-01-08: 500 mL via INTRAVENOUS

## 2022-01-08 MED ORDER — LIDOCAINE HCL (PF) 1 % IJ SOLN: 30.0000 mL | INTRAMUSCULAR | Status: DC | PRN

## 2022-01-08 MED ORDER — PHENYLEPHRINE 80 MCG/ML (10ML) SYRINGE FOR IV PUSH (FOR BLOOD PRESSURE SUPPORT): 80.0000 ug | PREFILLED_SYRINGE | INTRAVENOUS | Status: DC | PRN

## 2022-01-08 MED ORDER — FENTANYL CITRATE (PF) 100 MCG/2ML IJ SOLN
50.0000 ug | INTRAMUSCULAR | Status: DC | PRN
  Filled 2022-01-08: qty 2

## 2022-01-08 MED ORDER — OXYTOCIN-SODIUM CHLORIDE 30-0.9 UT/500ML-% IV SOLN
2.5000 [IU]/h | INTRAVENOUS | Status: DC
  Administered 2022-01-09: 2.5 [IU]/h via INTRAVENOUS
  Filled 2022-01-08: qty 500

## 2022-01-08 MED ORDER — LACTATED RINGERS IV SOLN: INTRAVENOUS | Status: DC

## 2022-01-08 MED ORDER — SOD CITRATE-CITRIC ACID 500-334 MG/5ML PO SOLN: 30.0000 mL | ORAL | Status: DC | PRN

## 2022-01-08 MED ORDER — MISOPROSTOL 50MCG HALF TABLET
50.0000 ug | ORAL_TABLET | ORAL | Status: DC
  Administered 2022-01-08: 50 ug via ORAL
  Filled 2022-01-08: qty 1

## 2022-01-08 MED ORDER — LACTATED RINGERS IV SOLN: 500.0000 mL | INTRAVENOUS | Status: DC | PRN

## 2022-01-08 MED ORDER — OXYTOCIN-SODIUM CHLORIDE 30-0.9 UT/500ML-% IV SOLN
1.0000 m[IU]/min | INTRAVENOUS | Status: DC
  Administered 2022-01-08: 2 m[IU]/min via INTRAVENOUS

## 2022-01-08 MED ORDER — TERBUTALINE SULFATE 1 MG/ML IJ SOLN
0.2500 mg | Freq: Once | INTRAMUSCULAR | Status: AC | PRN
  Administered 2022-01-09: 0.25 mg via SUBCUTANEOUS
  Filled 2022-01-08: qty 1

## 2022-01-08 MED ORDER — LIDOCAINE-EPINEPHRINE (PF) 2 %-1:200000 IJ SOLN
INTRAMUSCULAR | Status: DC | PRN
  Administered 2022-01-08: 5 mL via EPIDURAL

## 2022-01-08 NOTE — Progress Notes (Addendum)
Patient ID: Kandra Nicolas, female   DOB: Aug 05, 2001, 21 y.o.   MRN: [id number] SHIRL WEIR is a 21 y.o. G2P1001 at 54w0dadmitted for induction of labor due to postdates  Called to check pt, RNs unsure if still has cervix  Subjective: comfortable with epidural and feeling pressure  Objective: BP 118/76   Pulse 72   Temp 97.6 F (36.4 C) (Oral)   Resp 16   Ht '5\' 3"'$  (1.6 m)   Wt 113 kg   LMP 03/27/2021   SpO2 99%   BMI 44.14 kg/m  No intake/output data recorded.  FHR baseline 125 bpm, Variability: moderate, Accelerations:not present, Decelerations:  Present  variables/lates Toco: q 1-460m   SVE:   Dilation: 9 Effacement (%): 90 Station: 0 Exam by:: KiKnute NeuCNM +scalp stim  FSE & IUPC placed w/o difficulty  Labs: Lab Results  Component Value Date   WBC 6.7 01/08/2022   HGB 11.7 (L) 01/08/2022   HCT 35.8 (L) 01/08/2022   MCV 88.8 01/08/2022   PLT 227 01/08/2022    Assessment / Plan: IOL d/t postdates, pit off d/t decels, had improved, but now w/ lates/variables again, FSE & IUPC placed, good scalp stim, if decels not improved RN to give terb and let me know  Labor: active Fetal Wellbeing:  Category II Pain Control:  epidural Pre-eclampsia: N/A I/D:  PCN for GBS+ Anticipated MOD: NSVB  KiRoma SchanzNM, WHNP-BC 01/08/2022, 11:57 PM

## 2022-01-08 NOTE — Anesthesia Procedure Notes (Signed)
Epidural Patient location during procedure: OB Start time: 01/08/2022 5:00 PM End time: 01/08/2022 5:10 PM  Staffing Anesthesiologist: Freddrick March, MD Performed: anesthesiologist   Preanesthetic Checklist Completed: patient identified, IV checked, risks and benefits discussed, monitors and equipment checked, pre-op evaluation and timeout performed  Epidural Patient position: sitting Prep: DuraPrep and site prepped and draped Patient monitoring: continuous pulse ox, blood pressure, heart rate and cardiac monitor Approach: midline Location: L3-L4 Injection technique: LOR air  Needle:  Needle type: Tuohy  Needle gauge: 17 G Needle length: 9 cm Needle insertion depth: 7 cm Catheter type: closed end flexible Catheter size: 19 Gauge Catheter at skin depth: 13 cm Test dose: negative  Assessment Sensory level: T8 Events: blood not aspirated, injection not painful, no injection resistance, no paresthesia and negative IV test  Additional Notes Patient identified. Risks/Benefits/Options discussed with patient including but not limited to bleeding, infection, nerve damage, paralysis, failed block, incomplete pain control, headache, blood pressure changes, nausea, vomiting, reactions to medication both or allergic, itching and postpartum back pain. Confirmed with bedside nurse the patient's most recent platelet count. Confirmed with patient that they are not currently taking any anticoagulation, have any bleeding history or any family history of bleeding disorders. Patient expressed understanding and wished to proceed. All questions were answered. Sterile technique was used throughout the entire procedure. Please see nursing notes for vital signs. Test dose was given through epidural catheter and negative prior to continuing to dose epidural or start infusion. Warning signs of high block given to the patient including shortness of breath, tingling/numbness in hands, complete motor block,  or any concerning symptoms with instructions to call for help. Patient was given instructions on fall risk and not to get out of bed. All questions and concerns addressed with instructions to call with any issues or inadequate analgesia.  Reason for block:procedure for pain

## 2022-01-08 NOTE — Progress Notes (Signed)
Patient ID: Briana Curry, female   DOB: 03/24/2001, 21 y.o.   MRN: [id number] Briana Curry is a 21 y.o. G2P1001 at 69w0dadmitted for induction of labor due to postdates  Subjective: comfortable with epidural and no complaints  Objective: BP 121/74   Pulse 70   Temp 97.8 F (36.6 C) (Oral)   Resp 16   Ht '5\' 3"'$  (1.6 m)   Wt 113 kg   LMP 03/27/2021   SpO2 99%   BMI 44.14 kg/m  No intake/output data recorded.  FHR baseline 125 bpm, Variability: moderate, Accelerations:present, Decelerations:  Present  occ earlies Toco: q 1-511ms   SVE:   Dilation: 5.5 Effacement (%): 80 Station: -2 Exam by:: K.Doree FudgeCNM  Pitocin @ 8 mu/min  Labs: Lab Results  Component Value Date   WBC 6.7 01/08/2022   HGB 11.7 (L) 01/08/2022   HCT 35.8 (L) 01/08/2022   MCV 88.8 01/08/2022   PLT 227 01/08/2022    Assessment / Plan: IOL d/t postdates, s/p cytotec x 1, foley bulb (out @ 1600), AROM and pit @ [id number] increasing pit to achieve adequate labor. Repositioned to Rt exaggerated sims w/ peanut ball  Labor: early Fetal Wellbeing:  Category I Pain Control:  epidural Pre-eclampsia: N/A I/D:  PCN for GBS+ Anticipated MOD: NSVB  KiRoma SchanzNM, WHNP-BC 01/08/2022, 9:11 PM

## 2022-01-08 NOTE — Progress Notes (Signed)
Briana Curry is a 21 y.o. G2P1001 at 88w0dby LMP admitted for induction of labor due to Post dates. Due date 01/01/2022.  Subjective: FB out @ 1600. Patient uncomfortable with RN's attempted SVE. CNM in to check cervix.  Objective: BP 110/68 (BP Location: Left Arm)   Pulse 89   Temp 98.1 F (36.7 C) (Oral)   Resp 17   Ht '5\' 3"'$  (1.6 m)   Wt 113 kg   LMP 03/27/2021   BMI 44.14 kg/m  No intake/output data recorded. No intake/output data recorded.  FHT:  FHR: 140 bpm, variability: moderate,  accelerations:  Present,  decelerations:  Present variables UC:   regular, every 3-4 minutes SVE:   Dilation: 4.5 Effacement (%): 50 Station: -2 Exam by:: DRenato Battles CNM  Labs: Lab Results  Component Value Date   WBC 6.7 01/08/2022   HGB 11.7 (L) 01/08/2022   HCT 35.8 (L) 01/08/2022   MCV 88.8 01/08/2022   PLT 227 01/08/2022    Assessment / Plan: Induction of labor due to postterm,  progressing well on pitocin  Labor: Progressing normally Preeclampsia:   n/a Fetal Wellbeing:  Category I Pain Control:  Labor support without medications - requesting epidural I/D:   GBS (+) Anticipated MOD:  NSVD  RLaury Deep CNM 01/08/2022, 4:10 PM

## 2022-01-08 NOTE — Progress Notes (Signed)
Patient ID: Briana Curry, female   DOB: [birth year], 21 y.o.   MRN: [id number] Briana Curry is a 21 y.o. G2P1001 at 11w0dadmitted for induction of labor due to postdates  Subjective: comfortable with epidural and no complaints  Objective: BP 115/74   Pulse 73   Temp 97.8 F (36.6 C) (Oral)   Resp 16   Ht '5\' 3"'$  (1.6 m)   Wt 113 kg   LMP 03/27/2021   SpO2 99%   BMI 44.14 kg/m  No intake/output data recorded.  FHR baseline 125 bpm, Variability: moderate, Accelerations:present, Decelerations:  Present  variables/lates Toco: q 2-450ms   SVE:   9/90 by RN Pitocin turned off by RN  Labs: Lab Results  Component Value Date   WBC 6.7 01/08/2022   HGB 11.7 (L) 01/08/2022   HCT 35.8 (L) 01/08/2022   MCV 88.8 01/08/2022   PLT 227 01/08/2022    Assessment / Plan: IOL d/t postates, pit off right now d/t decels, now 9cm, position changed-decels improved, continue to monitor  Labor: active Fetal Wellbeing:  Category II Pain Control:  epidural Pre-eclampsia: N/A I/D:  PCN for GBS+ Anticipated MOD: NSVB  Briana SchanzNM, WHNP-BC 01/08/2022, 11:06 PM

## 2022-01-08 NOTE — Progress Notes (Signed)
Briana Curry is a 21 y.o. G2P1001 at 40w0dby LMP admitted for induction of labor due to Post dates. Due date 01/01/2022.  Subjective: Resting in RT lateral position with peanut ball in between legs. FB still in place. RN just applied more traction. Patient's mother and sister supportive at bedside.  Objective: BP 115/62   Pulse 99   Resp 16   Ht '5\' 3"'$  (1.6 m)   Wt 113 kg   LMP 03/27/2021   BMI 44.14 kg/m  No intake/output data recorded. No intake/output data recorded.  FHT:  FHR: 125 bpm, variability: moderate,  accelerations:  Present,  decelerations:  Present variable UC:   irregular, every 2-7.5 minutes SVE:   deferred  Labs: Lab Results  Component Value Date   WBC 6.7 01/08/2022   HGB 11.7 (L) 01/08/2022   HCT 35.8 (L) 01/08/2022   MCV 88.8 01/08/2022   PLT 227 01/08/2022    Assessment / Plan: Induction of labor due to postterm,  progressing well on pitocin  Labor: Progressing normally - FB still in place. Will consider second dose of Cytotec or Pitocin at 1600. Preeclampsia:   n/a Fetal Wellbeing:  Category I Pain Control:  Labor support without medications I/D:   GBS(+) Anticipated MOD:  NSVD  RLaury Deep CNM 01/08/2022, 2:47 PM

## 2022-01-08 NOTE — Progress Notes (Signed)
Briana Curry is a 21 y.o. G2P1001 at 9w0dby LMP admitted for induction of labor due to Post dates. Due date 01/01/22.  Subjective: Patient comfortable with epidural in place. Ready for AROM and start of Pitocin.  Objective: BP 108/71   Pulse 86   Temp 98.1 F (36.7 C) (Oral)   Resp 16   Ht '5\' 3"'$  (1.6 m)   Wt 113 kg   LMP 03/27/2021   SpO2 99%   BMI 44.14 kg/m  No intake/output data recorded. No intake/output data recorded.  FHT:  FHR: 130 bpm, variability: moderate,  accelerations:  Present,  decelerations:  Absent UC:   regular, every 3-5 minutes SVE:   Dilation: 5 Effacement (%): 80 Station: -1 Exam by:: Emanuelle Bastos CNM AROM with copious amounts of clear fluid in return. Patient tolerated procedure well.   Labs: Lab Results  Component Value Date   WBC 6.7 01/08/2022   HGB 11.7 (L) 01/08/2022   HCT 35.8 (L) 01/08/2022   MCV 88.8 01/08/2022   PLT 227 01/08/2022    Assessment / Plan: Induction of labor due to postterm  Labor: Progressing normally - Start Pitocin 2x2 Preeclampsia:   n/a Fetal Wellbeing:  Category I Pain Control:  Epidural I/D:   GBS (+) Anticipated MOD:  NSVD  RLaury Deep CNM 01/08/2022, 6:50 PM

## 2022-01-08 NOTE — H&P (Signed)
OBSTETRIC ADMISSION HISTORY AND PHYSICAL  Briana Curry is a 21 y.o. female G2P1001 with IUP at 3w0dby LMP presenting for IOL for postdates.   Reports fetal movement. Denies vaginal bleeding.  [facility]  Support person in labor: Mother  Ultrasounds Anatomy U/S: EIF noted on 1st anatomy U/S Est. FW: 2308  gm  5 lb 1 oz  66  % at 33 wks (11/14/2021)  Prenatal History/Complications: Obesity in Pregnancy GBS Bacteriuria Chlamydial Infection in Pregnancy Postdates Pregnancy at 423wks  OB BOX: Nursing Staff Provider  Office Location  [facility] Dating  LMP  Language  English Anatomy [place] Normal with EIF  Flu Vaccine  Declined 05/29/21 Genetic/Carrier Screen  NIPS:   negative AFP:  to late Horizon: negative  TDaP Vaccine   Declined 10/17/21 Hgb A1C or  GTT Early  Third trimester Normal 77-96-81  COVID Vaccine Completed   LAB RESULTS   Rhogam   Blood Type A/Positive/-- (11/03 1153) A positive  Baby Feeding Plan Bottle Antibody Negative (11/03 1153)negative  Contraception Pill? Rubella 6.06 (11/03 1153)immune  Circumcision Yes if a boy RPR Non Reactive (02/20 1024)   [facility]BsAg Negative (11/03 1153) negative  Support Person Mom HCVAb  negative  Prenatal Classes  HIV Non Reactive (02/20 1024)     BTL Consent  GBS    Positive urine  VBAC Consent  Pap n/a       DME Rx [Valu.Nieves] BP cuff [Valu.Nieves] Weight Scale PHQ9 & GAD7 [Valu.Nieves ] new OB [x ] 28 weeks  [  ] 36 weeks Induction  '[x]'$  Orders Entered '[ ]'$ Foley Y/N   Past Medical History: Past Medical History:  Diagnosis Date   Anemia    Asthma    Broken arm    Iron deficiency    Multiple allergies     Past Surgical History: Past Surgical History:  Procedure Laterality Date   ARM HARDWARE REMOVAL      Obstetrical History: OB History     Gravida  2   Para  1   Term  1   Preterm      AB      Living  1      SAB      IAB      Ectopic      Multiple  0    Live Births  1           Social History: Social History   Socioeconomic History   Marital status: Single    Spouse name: Not on file   Number of children: Not on file   Years of education: Not on file   Highest education level: Not on file  Occupational History   Not on file  Tobacco Use   Smoking status: Former    Types: Cigars    Quit date: 03/2021    Years since quitting: 0.8   Smokeless tobacco: Never  Vaping Use   Vaping Use: Never used  Substance and Sexual Activity   Alcohol use: Not Currently    Comment: not while preg   Drug use: No   Sexual activity: Yes    Partners: Male    Birth control/protection: None  Other Topics Concern   Not on file  Social History Narrative   ** Merged History Encounter **       Social Determinants of Health   Financial Resource Strain: Not on file  Food Insecurity: Not on file  Transportation Needs: Not on file  Physical Activity: Not on file  Stress: Not on file  Social Connections: Not on file    Family History: Family History  Problem Relation Age of Onset   Asthma Mother    Asthma Sister    Eczema Sister    Urticaria Sister    Asthma Brother    Eczema Brother     Allergies: Allergies  Allergen Reactions   Shrimp [Shellfish Allergy] Anaphylaxis    Pt states she is also allergic to roaches    Lactose Intolerance (Gi) Diarrhea   Other Rash    Wendy's sweet and sour sauce caused rash and seafood    Medications Prior to Admission  Medication Sig Dispense Refill Last Dose   Misc. Devices MISC Dispense one maternity belt for patient 1 each 0    Prenatal Vit-Fe Fumarate-FA (PRENATAL PLUS VITAMIN/MINERAL) 27-1 MG TABS Take 1 tablet by mouth daily. 90 tablet 2    terconazole (TERAZOL 7) 0.4 % vaginal cream Place 1 applicator vaginally at bedtime. 45 g 0      Review of Systems  All systems reviewed and negative except as stated in HPI  Height '5\' 3"'$  (1.6 m), weight 113 kg, last menstrual period 03/27/2021,  not currently breastfeeding. General appearance: alert, cooperative, and mildly anxious Lungs: no respiratory distress Heart: regular rate  Abdomen: soft, non-tender; gravid Pelvic: adequate - proven to 8 lbs 7.6 oz (04/07/20) Extremities: Homans sign is negative, no sign of DVT Presentation: cephalic Fetal monitoring: Baseline rate 130 bpm   Variability moderate  Accelerations present   Decelerations variable Uterine activity: UI noted Dilation: 1 Effacement (%): Thick Exam by:: Renato Battles, CNM Cooks catheter Balloon inserted using SSE, uterine balloon inflated to 80 cc with sterile H2O, vaginal balloon left deflated  Prenatal labs: ABO, Rh: --/--/PENDING (05/23 1116) Antibody: PENDING (05/23 1116) Rubella: 6.06 (11/03 1153) RPR: Non Reactive (02/20 1024)  HBsAg: Negative (11/03 1153)  HIV: Non Reactive (02/20 1024)  GBS: Negative/-- (04/18 1516)  Glucola: Normal (27-06-23) Genetic screening:  Negative NIPS  Prenatal Transfer Tool  Maternal Diabetes: No Genetic Screening: Normal Maternal Ultrasounds/Referrals: Isolated EIF (echogenic intracardiac focus) Fetal Ultrasounds or other Referrals:  None Maternal Substance Abuse:  No Significant Maternal Medications:  None Significant Maternal Lab Results: Group B Strep positive - bacteriuria  Results for orders placed or performed during the hospital encounter of 01/08/22 (from the past 24 hour(s))  CBC   Collection Time: 01/08/22 11:16 AM  Result Value Ref Range   WBC 6.7 4.0 - 10.5 K/uL   RBC 4.03 3.87 - 5.11 MIL/uL   Hemoglobin 11.7 (L) 12.0 - 15.0 g/dL   HCT 35.8 (L) 36.0 - 46.0 %   MCV 88.8 80.0 - 100.0 fL   MCH 29.0 26.0 - 34.0 pg   MCHC 32.7 30.0 - 36.0 g/dL   RDW 13.9 11.5 - 15.5 %   Platelets 227 150 - 400 K/uL   nRBC 0.0 0.0 - 0.2 %  Type and screen   Collection Time: 01/08/22 11:16 AM  Result Value Ref Range   ABO/RH(D) PENDING    Antibody Screen PENDING    Sample Expiration      01/11/2022,2359 Performed at  [facility], [place] [address], [place], [place] [address]     Patient Active Problem List   Diagnosis Date Noted   Post-dates pregnancy 01/08/2022   Chlamydia infection affecting pregnancy 11/21/2021   History of COVID-19 09/03/2021  GBS bacteriuria 07/22/2021   Encounter for supervision of normal pregnancy, antepartum 05/29/2021   Obesity (BMI 30-39.9) 09/09/2019   Other allergic rhinitis 09/21/2018   Lactose intolerance 11/03/2017   BMI 40.0-44.9, adult ([place]) 07/25/2016    Assessment/Plan:  Briana Curry is a 21 y.o. G2P1001 at 91w0dhere for IOL for postdates at 438weeks  Labor: IOL -- FB placed -- pain control: planning epidural   Fetal Wellbeing: EFW 6.5 lbs by Leopold's. Cephalic by Leopold's.  -- GBS (+) -- continuous fetal monitoring - category 1   Postpartum Planning -- bottle -- unsure of method for contraception    RLaury Deep CNM  01/08/2022, 12:09 PM

## 2022-01-08 NOTE — Anesthesia Preprocedure Evaluation (Signed)
Anesthesia Evaluation  Patient identified by MRN, date of birth, ID band Patient awake    Reviewed: Allergy & Precautions, NPO status , Patient's Chart, lab work & pertinent test results  Airway Mallampati: III  TM Distance: >3 FB Neck ROM: Full    Dental no notable dental hx.    Pulmonary asthma , former smoker,    Pulmonary exam normal breath sounds clear to auscultation       Cardiovascular negative cardio ROS Normal cardiovascular exam Rhythm:Regular Rate:Normal     Neuro/Psych negative neurological ROS  negative psych ROS   GI/Hepatic negative GI ROS, Neg liver ROS,   Endo/Other  Morbid obesity (BMI 44)  Renal/GU negative Renal ROS  negative genitourinary   Musculoskeletal negative musculoskeletal ROS (+)   Abdominal   Peds  Hematology negative hematology ROS (+)   Anesthesia Other Findings IOL for postdates  Reproductive/Obstetrics (+) Pregnancy                             Anesthesia Physical Anesthesia Plan  ASA: 3  Anesthesia Plan: Epidural   Post-op Pain Management:    Induction:   PONV Risk Score and Plan: Treatment may vary due to age or medical condition  Airway Management Planned: Natural Airway  Additional Equipment:   Intra-op Plan:   Post-operative Plan:   Informed Consent: I have reviewed the patients History and Physical, chart, labs and discussed the procedure including the risks, benefits and alternatives for the proposed anesthesia with the patient or authorized representative who has indicated his/her understanding and acceptance.       Plan Discussed with: Anesthesiologist  Anesthesia Plan Comments: (Patient identified. Risks, benefits, options discussed with patient including but not limited to bleeding, infection, nerve damage, paralysis, failed block, incomplete pain control, headache, blood pressure changes, nausea, vomiting, reactions to  medication, itching, and post partum back pain. Confirmed with bedside nurse the patient's most recent platelet count. Confirmed with the patient that they are not taking any anticoagulation, have any bleeding history or any family history of bleeding disorders. Patient expressed understanding and wishes to proceed. All questions were answered. )        Anesthesia Quick Evaluation

## 2022-01-09 ENCOUNTER — Encounter (HOSPITAL_COMMUNITY): Payer: Self-pay | Admitting: Obstetrics and Gynecology

## 2022-01-09 DIAGNOSIS — Z3A41 41 weeks gestation of pregnancy: Secondary | ICD-10-CM

## 2022-01-09 DIAGNOSIS — O99820 Streptococcus B carrier state complicating pregnancy: Secondary | ICD-10-CM

## 2022-01-09 DIAGNOSIS — O480 Post-term pregnancy: Secondary | ICD-10-CM

## 2022-01-09 LAB — RPR: RPR Ser Ql: NONREACTIVE

## 2022-01-09 MED ORDER — PRENATAL MULTIVITAMIN CH
1.0000 | ORAL_TABLET | Freq: Every day | ORAL | Status: DC
  Administered 2022-01-09: 1 via ORAL
  Filled 2022-01-09: qty 1

## 2022-01-09 MED ORDER — MISOPROSTOL 200 MCG PO TABS: 800.0000 ug | ORAL_TABLET | Freq: Once | ORAL | Status: DC

## 2022-01-09 MED ORDER — ONDANSETRON HCL 4 MG/2ML IJ SOLN
4.0000 mg | INTRAMUSCULAR | Status: DC | PRN
Start: 2022-01-09 — End: 2022-01-10

## 2022-01-09 MED ORDER — DIBUCAINE (PERIANAL) 1 % EX OINT
1.0000 | TOPICAL_OINTMENT | CUTANEOUS | Status: DC | PRN
Start: 2022-01-09 — End: 2022-01-10

## 2022-01-09 MED ORDER — SENNOSIDES-DOCUSATE SODIUM 8.6-50 MG PO TABS
2.0000 | ORAL_TABLET | Freq: Every day | ORAL | Status: DC
  Filled 2022-01-09: qty 2

## 2022-01-09 MED ORDER — SODIUM CHLORIDE 0.9 % IV SOLN
250.0000 mL | INTRAVENOUS | Status: DC | PRN
Start: 2022-01-09 — End: 2022-01-10

## 2022-01-09 MED ORDER — COCONUT OIL OIL
1.0000 | TOPICAL_OIL | Status: DC | PRN
Start: 2022-01-09 — End: 2022-01-10

## 2022-01-09 MED ORDER — SIMETHICONE 80 MG PO CHEW
80.0000 mg | CHEWABLE_TABLET | ORAL | Status: DC | PRN
Start: 2022-01-09 — End: 2022-01-10

## 2022-01-09 MED ORDER — MISOPROSTOL 200 MCG PO TABS
800.0000 ug | ORAL_TABLET | Freq: Once | ORAL | Status: AC
  Administered 2022-01-09: 800 ug via BUCCAL

## 2022-01-09 MED ORDER — ACETAMINOPHEN 325 MG PO TABS
650.0000 mg | ORAL_TABLET | ORAL | Status: DC | PRN
  Administered 2022-01-09: 650 mg via ORAL
  Filled 2022-01-09: qty 2

## 2022-01-09 MED ORDER — FLEET ENEMA 7-19 GM/118ML RE ENEM
1.0000 | ENEMA | Freq: Every day | RECTAL | Status: DC | PRN
Start: 2022-01-09 — End: 2022-01-10

## 2022-01-09 MED ORDER — WITCH HAZEL-GLYCERIN EX PADS: 1.0000 "application " | MEDICATED_PAD | CUTANEOUS | Status: DC | PRN

## 2022-01-09 MED ORDER — SODIUM CHLORIDE 0.9% FLUSH: 3.0000 mL | INTRAVENOUS | Status: DC | PRN

## 2022-01-09 MED ORDER — ZOLPIDEM TARTRATE 5 MG PO TABS: 5.0000 mg | ORAL_TABLET | Freq: Every evening | ORAL | Status: DC | PRN

## 2022-01-09 MED ORDER — BENZOCAINE-MENTHOL 20-0.5 % EX AERO: 1.0000 "application " | INHALATION_SPRAY | CUTANEOUS | Status: DC | PRN

## 2022-01-09 MED ORDER — TETANUS-DIPHTH-ACELL PERTUSSIS 5-2.5-18.5 LF-MCG/0.5 IM SUSY: 0.5000 mL | PREFILLED_SYRINGE | Freq: Once | INTRAMUSCULAR | Status: DC

## 2022-01-09 MED ORDER — FENTANYL CITRATE (PF) 100 MCG/2ML IJ SOLN: 100.0000 ug | Freq: Once | INTRAMUSCULAR | Status: AC

## 2022-01-09 MED ORDER — DIPHENHYDRAMINE HCL 25 MG PO CAPS: 25.0000 mg | ORAL_CAPSULE | Freq: Four times a day (QID) | ORAL | Status: DC | PRN

## 2022-01-09 MED ORDER — ONDANSETRON HCL 4 MG PO TABS: 4.0000 mg | ORAL_TABLET | ORAL | Status: DC | PRN

## 2022-01-09 MED ORDER — FENTANYL CITRATE (PF) 100 MCG/2ML IJ SOLN
INTRAMUSCULAR | Status: AC
  Administered 2022-01-09: 100 ug via INTRAVENOUS
  Filled 2022-01-09: qty 2

## 2022-01-09 MED ORDER — SODIUM CHLORIDE 0.9% FLUSH
3.0000 mL | Freq: Two times a day (BID) | INTRAVENOUS | Status: DC
  Administered 2022-01-09: 3 mL via INTRAVENOUS

## 2022-01-09 MED ORDER — MISOPROSTOL 200 MCG PO TABS
ORAL_TABLET | ORAL | Status: AC
  Filled 2022-01-09: qty 4

## 2022-01-09 MED ORDER — IBUPROFEN 600 MG PO TABS
600.0000 mg | ORAL_TABLET | Freq: Four times a day (QID) | ORAL | Status: DC
  Administered 2022-01-09 – 2022-01-10 (×5): 600 mg via ORAL
  Filled 2022-01-09 (×6): qty 1

## 2022-01-09 MED ORDER — MEASLES, MUMPS & RUBELLA VAC IJ SOLR: 0.5000 mL | Freq: Once | INTRAMUSCULAR | Status: DC

## 2022-01-09 MED ORDER — BISACODYL 10 MG RE SUPP: 10.0000 mg | Freq: Every day | RECTAL | Status: DC | PRN

## 2022-01-09 NOTE — Progress Notes (Signed)
Patient ID: Kandra Nicolas, female   DOB: [birth year], 21 y.o.   MRN: [id number] Called by L&D RN who just transferred pt to [facility], for increased bleeding.  Pt w/o complaints, no dizziness/lightheadedness BP 119/75 (BP Location: Left Arm)   Pulse 85   Temp 99 F (37.2 C) (Oral)   Resp 18   Ht '5\' 3"'$  (1.6 m)   Wt 113 kg   LMP 03/27/2021   SpO2 100%   Breastfeeding Unknown   BMI 44.14 kg/m   Fentanyl IV given, FF u-1, LUS cleared of ~50cc clots, cytotec 462mg buccal and 4028m pr given, ~20045mrine excreted w/ fundal massage, FF u-1, no trickling KimRoma SchanzNM, [facility] Center24/2023 3:29 AM

## 2022-01-09 NOTE — Discharge Summary (Signed)
Postpartum Discharge Summary    Patient Name: Briana Curry DOB: Nov 15, 2000 MRN: [id number]  Date of admission: 01/08/2022 Delivery date:01/09/2022  Delivering provider: [name]  Date of discharge: 01/10/2022  Admitting diagnosis: Post-dates pregnancy [O48.0] Intrauterine pregnancy: [redacted]w[redacted]d    Secondary diagnosis:  Principal Problem:   Post-dates pregnancy  Additional problems: none    Discharge diagnosis: Term Pregnancy Delivered                                              Post partum procedures: none Augmentation: AROM, Pitocin, Cytotec, and IP Foley Complications: None  Hospital course: Induction of Labor With Vaginal Delivery   21y.o. yo G2P1001 at 21w1das admitted to the hospital 01/08/2022 for induction of labor.  Indication for induction: Postdates.  Patient had an uncomplicated labor course as follows: Membrane Rupture Time/Date: 6:46 PM ,01/08/2022   Delivery Method:Vaginal, Spontaneous  Episiotomy: None  Lacerations:  1st degree  Details of delivery can be found in separate delivery note.  Patient had a routine postpartum course. Patient is discharged home 01/10/22.  Newborn Data: Birth date:01/09/2022  Birth time:1:15 AM  Gender:Female  Living status:Living  Apgars:9 ,9  Weight:8 lb 8.9 oz (3.88 kg)   Magnesium Sulfate received: No BMZ received: No Rhophylac:N/A MMR:N/A T-DaP: Declined Flu: N/A Transfusion:No  Physical exam  Vitals:   01/09/22 1229 01/09/22 1630 01/09/22 1925 01/10/22 0404  BP: 130/84 126/82 119/71 105/73  Pulse: 70 72 81 85  Resp: 18 18 17 16   Temp: 98 F (36.7 C) 98.2 F (36.8 C) 97.9 F (36.6 C) 98.2 F (36.8 C)  TempSrc: Oral Oral Oral Oral  SpO2: 100% 100%    Weight:      Height:       General: alert, cooperative, and no distress Lochia: appropriate Uterine Fundus: firm Incision: N/A DVT Evaluation: No evidence of DVT seen on physical exam. Labs: Lab Results  Component Value Date   WBC 6.7 01/08/2022    HGB 11.7 (L) 01/08/2022   HCT 35.8 (L) 01/08/2022   MCV 88.8 01/08/2022   PLT 227 01/08/2022      Latest Ref Rng & Units 07/22/2021    3:58 PM  CMP  Glucose 70 - 99 mg/dL 77    BUN 6 - 20 mg/dL 6    Creatinine 0.44 - 1.00 mg/dL 0.56    Sodium 135 - 145 mmol/L 135    Potassium 3.5 - 5.1 mmol/L 3.7    Chloride 98 - 111 mmol/L 104    CO2 22 - 32 mmol/L 22    Calcium 8.9 - 10.3 mg/dL 8.7    Total Protein 6.5 - 8.1 g/dL 6.7    Total Bilirubin 0.3 - 1.2 mg/dL 0.4    Alkaline Phos 38 - 126 U/L 56    AST 15 - 41 U/L 17    ALT 0 - 44 U/L 13     Edinburgh Score:    04/08/2020    8:26 AM  Edinburgh Postnatal Depression Scale Screening Tool  I have been able to laugh and see the funny side of things. 1  I have looked forward with enjoyment to things. 1  I have blamed myself unnecessarily when things went wrong. 1  I have been anxious or worried for no good reason. 1  I have felt scared or panicky for no good reason.  2  Things have been getting on top of me. 1  I have been so unhappy that I have had difficulty sleeping. 1  I have felt sad or miserable. 3  I have been so unhappy that I have been crying. 1  The thought of harming myself has occurred to me. 0  Edinburgh Postnatal Depression Scale Total 12     After visit meds:  Allergies as of 01/10/2022       Reactions   Shrimp [shellfish Allergy] Anaphylaxis   Pt states she is also allergic to roaches    Lactose Intolerance (gi) Diarrhea   Other Rash   Wendy's sweet and sour sauce caused rash and seafood        Medication List     STOP taking these medications    Misc. Devices Misc   terconazole 0.4 % vaginal cream Commonly known as: TERAZOL 7       TAKE these medications    acetaminophen 325 MG tablet Commonly known as: Tylenol Take 2 tablets (650 mg total) by mouth every 4 (four) hours as needed (for pain scale < 4).   ibuprofen 600 MG tablet Commonly known as: ADVIL Take 1 tablet (600 mg total) by mouth  every 6 (six) hours.   Prenatal Plus Vitamin/Mineral 27-1 MG Tabs Take 1 tablet by mouth daily.         Discharge home in stable condition Infant Feeding: Bottle and Breast Infant Disposition:home with mother Discharge instruction: per After Visit Summary and Postpartum booklet. Activity: Advance as tolerated. Pelvic rest for 6 weeks.  Diet: routine diet Future Appointments: Future Appointments  Date Time Provider [place]  02/20/2022  2:10 PM Griffin Basil, MD [place] None    Follow up Visit: [name], CNM  P [facility] Please schedule this patient for PP visit in: 4-6wks  Low risk pregnancy complicated by: nothing  Delivery mode:  SVD  Anticipated Birth Control:  other/unsure  PP Procedures needed: none  Schedule Integrated [place] visit: no  Provider: Any provider    01/10/2022 Gabriel Carina, CNM

## 2022-01-09 NOTE — Anesthesia Postprocedure Evaluation (Signed)
Anesthesia Post Note  Patient: Briana Curry  Procedure(s) Performed: AN AD HOC LABOR EPIDURAL     Patient location during evaluation: Mother Baby Anesthesia Type: Epidural Level of consciousness: awake and alert Pain management: pain level controlled Vital Signs Assessment: post-procedure vital signs reviewed and stable Respiratory status: spontaneous breathing, nonlabored ventilation and respiratory function stable Cardiovascular status: stable Postop Assessment: no headache, no backache and epidural receding Anesthetic complications: no   No notable events documented.  Last Vitals:  Vitals:   01/09/22 0309 01/09/22 0415  BP: 119/75 118/71  Pulse: 85 84  Resp: 18 18  Temp: 37.2 C 37.1 C  SpO2: 100% 100%    Last Pain:  Vitals:   01/09/22 0415  TempSrc: Oral  PainSc: 4    Pain Goal:                   [name]

## 2022-01-10 MED ORDER — ACETAMINOPHEN 325 MG PO TABS: 650.0000 mg | ORAL_TABLET | ORAL | 0 refills | Status: DC | PRN

## 2022-01-10 MED ORDER — IBUPROFEN 600 MG PO TABS: 600.0000 mg | ORAL_TABLET | Freq: Four times a day (QID) | ORAL | 0 refills | Status: DC

## 2022-01-10 NOTE — Lactation Note (Signed)
This note was copied from a baby's chart. Lactation Consultation Note  Patient Name: Briana Curry TRRNH'A Date: 01/10/2022   Age:21 hours P2, term female infant. Per RN, mom is formula feeding only. Maternal Data    Feeding Nipple Type: Slow - flow  LATCH Score                    Lactation Tools Discussed/Used    Interventions    Discharge    Consult Status      Vicente Serene 01/10/2022, 12:52 AM

## 2022-01-16 ENCOUNTER — Telehealth: Payer: Self-pay | Admitting: *Deleted

## 2022-01-16 NOTE — Telephone Encounter (Signed)
Pt called to office stating she still has pain from delivery, has 1st degree tear.  Pt made aware that she may feel uncomfortable for a few weeks as her perineum is healing. Sutures should dissolve as healing occurs. Pt also made aware she may continue to have some cramping as she is breast/bottle feeding.  Pt advised to continue to take Tylenol and Ibuprofen as given.  Advised she may want to use peri bottle when using restroom and pat dry. May use sitz bath if needed. Advised to avoid constipation and added pressure to bottom.   Pt advised to keep upcoming PP appt and contact office if needed sooner.

## 2022-01-18 ENCOUNTER — Telehealth (HOSPITAL_COMMUNITY): Payer: Self-pay | Admitting: *Deleted

## 2022-01-18 NOTE — Telephone Encounter (Signed)
Mom reports feeling good. No concerns about herself at this time. EPDS=0 [facility] score=0) Mom reports baby is doing well. Feeding, peeing, and pooping without difficulty. Safe sleep reviewed. Mom reports no concerns about baby at present.  Odis Hollingshead, RN 01-18-2022 at 9:45am

## 2022-02-11 ENCOUNTER — Ambulatory Visit (INDEPENDENT_AMBULATORY_CARE_PROVIDER_SITE_OTHER): Payer: Medicaid Other

## 2022-02-11 ENCOUNTER — Ambulatory Visit (INDEPENDENT_AMBULATORY_CARE_PROVIDER_SITE_OTHER): Payer: Medicaid Other | Admitting: Podiatry

## 2022-02-11 DIAGNOSIS — M79672 Pain in left foot: Secondary | ICD-10-CM

## 2022-02-11 DIAGNOSIS — S90852D Superficial foreign body, left foot, subsequent encounter: Secondary | ICD-10-CM

## 2022-02-11 DIAGNOSIS — D492 Neoplasm of unspecified behavior of bone, soft tissue, and skin: Secondary | ICD-10-CM

## 2022-02-18 NOTE — Progress Notes (Signed)
Subjective: 21 year old female presents the office today for evaluation of a callus on the bottom of her heel.  She states that soon about the same and constant tenderness and skin larger.  She states that she did stepped on something while in shower but does not remember going through the foot.  She denies any bleeding afterwards.  No swelling redness or drainage.  Objective: AAO x3, NAD DP/PT pulses palpable bilaterally, CRT less than 3 seconds There is tenderness palpation along the area with thick hyperkeratotic lesion present on the left heel.  There is no underlying ulceration drainage or any signs of infection. No pain with calf compression, swelling, warmth, erythema  Assessment: Left foot callus, rule out foreign body.  Plan: -All treatment options discussed with the patient including all alternatives, risks, complications.  -X-rays were obtained reviewed of the left foot.  No evidence of acute fracture or foreign body. -Sharply debrided the hyperkeratotic lesion without any complications or bleeding.  Skin was cleaned with alcohol and a pad was placed followed by salicylic acid and a bandage.  Postprocedure instructions discussed.  Monitor for any signs or symptoms of infection. -I am going to order diagnostic ultrasound to rule out any underlying foreign body. -Patient encouraged to call the office with any questions, concerns, change in symptoms.   Trula Slade DPM

## 2022-02-20 ENCOUNTER — Encounter: Payer: Self-pay | Admitting: Obstetrics and Gynecology

## 2022-02-20 ENCOUNTER — Ambulatory Visit (INDEPENDENT_AMBULATORY_CARE_PROVIDER_SITE_OTHER): Payer: Medicaid Other | Admitting: Obstetrics and Gynecology

## 2022-02-20 ENCOUNTER — Other Ambulatory Visit (HOSPITAL_COMMUNITY)
Admission: RE | Admit: 2022-02-20 | Discharge: 2022-02-20 | Disposition: A | Discharge status: Home / Self Care | Payer: Medicaid Other | Source: Ambulatory Visit | Attending: Obstetrics and Gynecology | Admitting: Obstetrics and Gynecology

## 2022-02-20 DIAGNOSIS — N898 Other specified noninflammatory disorders of vagina: Secondary | ICD-10-CM | POA: Insufficient documentation

## 2022-02-20 DIAGNOSIS — Z30016 Encounter for initial prescription of transdermal patch hormonal contraceptive device: Secondary | ICD-10-CM | POA: Diagnosis not present

## 2022-02-20 MED ORDER — NORELGESTROMIN-ETH ESTRADIOL 150-35 MCG/24HR TD PTWK: 1.0000 | MEDICATED_PATCH | TRANSDERMAL | 12 refills | Status: DC

## 2022-02-20 NOTE — Progress Notes (Signed)
[facility] Partum Visit Note  Briana Curry is a 21 y.o. [id number] female who presents for a postpartum visit. She is 6 week postpartum following a normal spontaneous vaginal delivery.  I have fully reviewed the prenatal and intrapartum course. The delivery was at 62 gestational weeks.  Anesthesia: epidural. Postpartum course has been unremarkable. Baby is doing well. Baby is feeding by bottle - Gerber Goodstart Gentle  . Bleeding no bleeding. Bowel function is normal. Bladder function is normal. Patient is sexually active. Contraception method is Public relations account executive weekly. Postpartum depression screening: negative.   The pregnancy intention screening data noted above was reviewed. Potential methods of contraception were discussed. The patient elected to proceed with No data recorded.   Edinburgh Postnatal Depression Scale - 02/20/22 1417       Edinburgh Postnatal Depression Scale:  In the Past 7 Days   I have been able to laugh and see the funny side of things. 0    I have looked forward with enjoyment to things. 0    I have blamed myself unnecessarily when things went wrong. 0    I have been anxious or worried for no good reason. 0    I have felt scared or panicky for no good reason. 0    Things have been getting on top of me. 0    I have been so unhappy that I have had difficulty sleeping. 0    I have felt sad or miserable. 0    I have been so unhappy that I have been crying. 0    The thought of harming myself has occurred to me. 0    Edinburgh Postnatal Depression Scale Total 0             Health Maintenance Due  Topic Date Due   COVID-19 Vaccine (1) Never done    The following portions of the patient's history were reviewed and updated as appropriate: allergies, current medications, past family history, past medical history, past social history, past surgical history, and problem list.  Review of Systems Pertinent items are noted in HPI.  Objective:  BP 111/73   Pulse 81    Ht '5\' 3"'$  (1.6 m)   Wt 218 lb (98.9 kg)   LMP 03/27/2021   Breastfeeding No   BMI 38.62 kg/m    General:  alert, cooperative, no distress, and moderately obese   Breasts:  not indicated  Lungs: clear to auscultation bilaterally  Heart:  regular rate and rhythm  Abdomen: soft, non-tender; bowel sounds normal; no masses,  no organomegaly   Wound N/a  GU exam:  not indicated       Assessment:    Postpartum exam  Normal postpartum exam.   Plan:   Essential components of care per ACOG recommendations:  1.  Mood and well being: Patient with negative depression screening today. Reviewed local resources for support.  - Patient tobacco use? No.   - hx of drug use? No.    2. Infant care and feeding:  -Patient currently breastmilk feeding? No.  -Social determinants of health (SDOH) reviewed in EPIC. No concerns.  3. Sexuality, contraception and birth spacing - Patient does not want a pregnancy in the next year.  Desired family size is 3 children.  - Reviewed reproductive life planning. Reviewed contraceptive methods based on pt preferences and effectiveness.  Patient desired Contraceptive Patch today.   - Discussed birth spacing of 18 months  Rx given for xulane contraceptive patch  4.  Sleep and fatigue -Encouraged family/partner/community support of 4 hrs of uninterrupted sleep to help with mood and fatigue  5. Physical Recovery  - Discussed patients delivery and complications. She describes her labor as good. - Patient had a Vaginal, no problems at delivery. Patient had a 1st degree laceration. Perineal healing reviewed. Patient expressed understanding - Patient has urinary incontinence? No. - Patient is safe to resume physical and sexual activity  6.  Health Maintenance - HM due items addressed Yes - Last pap smear Pap smear not done at today's visit.  -Breast Cancer screening indicated? No.   7. Chronic Disease/Pregnancy Condition follow up: None  - PCP follow  up  Pap in 1 year  Griffin Basil, [place] for [employer], [place]

## 2022-02-21 LAB — CERVICOVAGINAL ANCILLARY ONLY
Bacterial Vaginitis (gardnerella): NEGATIVE
Candida Glabrata: NEGATIVE
Candida Vaginitis: NEGATIVE
Chlamydia: NEGATIVE
Comment: NEGATIVE
Comment: NEGATIVE
Comment: NEGATIVE
Comment: NEGATIVE
Comment: NEGATIVE
Comment: NORMAL
Neisseria Gonorrhea: NEGATIVE
Trichomonas: NEGATIVE

## 2022-02-26 ENCOUNTER — Ambulatory Visit
Admission: RE | Admit: 2022-02-26 | Discharge: 2022-02-26 | Disposition: A | Discharge status: Home / Self Care | Payer: Medicaid Other | Source: Ambulatory Visit | Attending: Podiatry | Admitting: Podiatry

## 2022-02-26 DIAGNOSIS — S90852D Superficial foreign body, left foot, subsequent encounter: Secondary | ICD-10-CM

## 2022-03-01 ENCOUNTER — Encounter: Payer: Self-pay | Admitting: Podiatry

## 2022-03-04 NOTE — Progress Notes (Signed)
Left message for patient to call back for appointment.

## 2022-03-14 ENCOUNTER — Other Ambulatory Visit (HOSPITAL_COMMUNITY)
Admission: RE | Admit: 2022-03-14 | Discharge: 2022-03-14 | Disposition: A | Discharge status: Home / Self Care | Payer: Medicaid Other | Source: Ambulatory Visit | Attending: Obstetrics and Gynecology | Admitting: Obstetrics and Gynecology

## 2022-03-14 ENCOUNTER — Ambulatory Visit (INDEPENDENT_AMBULATORY_CARE_PROVIDER_SITE_OTHER): Payer: Medicaid Other | Admitting: *Deleted

## 2022-03-14 DIAGNOSIS — Z113 Encounter for screening for infections with a predominantly sexual mode of transmission: Secondary | ICD-10-CM

## 2022-03-14 DIAGNOSIS — N898 Other specified noninflammatory disorders of vagina: Secondary | ICD-10-CM | POA: Insufficient documentation

## 2022-03-14 NOTE — Progress Notes (Signed)
SUBJECTIVE:  21 y.o. female complains of vaginal discharge, ?bv/yeast.  Pt request std screening today.  Denies abnormal vaginal bleeding or significant pelvic pain or fever. No UTI symptoms. Denies history of known exposure to STD.  Patient's last menstrual period was 03/27/2021.  OBJECTIVE:  She appears well, afebrile. Urine dipstick: not done.  ASSESSMENT:  Vaginal Discharge  Vaginal Odor   PLAN:  GC, chlamydia, trichomonas, BVAG, CVAG probe sent to lab. Treatment: To be determined once lab results are received ROV prn if symptoms persist or worsen.

## 2022-03-14 NOTE — Progress Notes (Signed)
Agree with nurses's documentation of this patient's clinic encounter.  Megin Consalvo L, MD  

## 2022-03-15 LAB — CERVICOVAGINAL ANCILLARY ONLY
Bacterial Vaginitis (gardnerella): NEGATIVE
Candida Glabrata: NEGATIVE
Candida Vaginitis: POSITIVE — AB
Chlamydia: NEGATIVE
Comment: NEGATIVE
Comment: NEGATIVE
Comment: NEGATIVE
Comment: NEGATIVE
Comment: NEGATIVE
Comment: NORMAL
Neisseria Gonorrhea: NEGATIVE
Trichomonas: NEGATIVE

## 2022-03-18 ENCOUNTER — Telehealth: Payer: Self-pay

## 2022-03-18 ENCOUNTER — Other Ambulatory Visit: Payer: Self-pay

## 2022-03-18 DIAGNOSIS — B3731 Acute candidiasis of vulva and vagina: Secondary | ICD-10-CM

## 2022-03-18 MED ORDER — FLUCONAZOLE 150 MG PO TABS: 150.0000 mg | ORAL_TABLET | Freq: Once | ORAL | 0 refills | Status: AC

## 2022-03-18 NOTE — Telephone Encounter (Signed)
I connected with  Briana Curry on 03/18/22 by telephone and verified that I am speaking with the correct person using two identifiers.     Pt made aware of lab results, rx sent now.  Pt verbalized understanding and has no further questions.

## 2022-03-18 NOTE — Telephone Encounter (Signed)
-----   Message from Chancy Milroy, MD sent at 03/16/2022  4:59 PM EDT ----- Please send in Rx for yeast as per protocol. Please let pt know Thanks Legrand Como

## 2022-03-19 NOTE — Progress Notes (Signed)
No answer no vm , tried to reach patient for appt .

## 2022-03-28 ENCOUNTER — Ambulatory Visit: Payer: Medicaid Other

## 2022-04-02 ENCOUNTER — Ambulatory Visit (INDEPENDENT_AMBULATORY_CARE_PROVIDER_SITE_OTHER): Payer: Medicaid Other

## 2022-04-02 VITALS — BP 115/76 | HR 67 | Ht 63.0 in | Wt 222.8 lb

## 2022-04-02 DIAGNOSIS — Z3202 Encounter for pregnancy test, result negative: Secondary | ICD-10-CM

## 2022-04-02 LAB — POCT URINE PREGNANCY: Preg Test, Ur: NEGATIVE

## 2022-04-02 NOTE — Progress Notes (Signed)
Briana Curry is a 21 y.o. female who presents for UPT. LMP 03/28/22. No symptoms. Patient had not had her period yet when she made the appointment, but has since had one. UPT was negative. Patient informed to follow-up as needed.

## 2022-04-02 NOTE — Progress Notes (Signed)
Agree with nurses's documentation of this patient's clinic encounter.  Taray Normoyle L, MD  

## 2022-04-08 ENCOUNTER — Encounter: Payer: Self-pay | Admitting: Obstetrics

## 2022-04-09 ENCOUNTER — Other Ambulatory Visit: Payer: Self-pay

## 2022-04-09 MED ORDER — FLUCONAZOLE 150 MG PO TABS: 150.0000 mg | ORAL_TABLET | Freq: Once | ORAL | 0 refills | Status: AC

## 2022-04-09 NOTE — Progress Notes (Signed)
Rx sent per protocol

## 2022-04-12 ENCOUNTER — Other Ambulatory Visit: Payer: Self-pay

## 2022-04-12 ENCOUNTER — Other Ambulatory Visit: Payer: Self-pay | Admitting: Obstetrics and Gynecology

## 2022-04-12 DIAGNOSIS — N898 Other specified noninflammatory disorders of vagina: Secondary | ICD-10-CM

## 2022-04-12 DIAGNOSIS — B9689 Other specified bacterial agents as the cause of diseases classified elsewhere: Secondary | ICD-10-CM

## 2022-04-12 MED ORDER — METRONIDAZOLE 500 MG PO TABS: 500.0000 mg | ORAL_TABLET | Freq: Two times a day (BID) | ORAL | 0 refills | Status: DC

## 2022-04-17 ENCOUNTER — Ambulatory Visit (INDEPENDENT_AMBULATORY_CARE_PROVIDER_SITE_OTHER): Payer: Medicaid Other | Admitting: Emergency Medicine

## 2022-04-17 ENCOUNTER — Other Ambulatory Visit (HOSPITAL_COMMUNITY)
Admission: RE | Admit: 2022-04-17 | Discharge: 2022-04-17 | Disposition: A | Discharge status: Home / Self Care | Payer: Medicaid Other | Source: Ambulatory Visit | Attending: Obstetrics and Gynecology | Admitting: Obstetrics and Gynecology

## 2022-04-17 VITALS — BP 102/69 | HR 70 | Ht 65.0 in | Wt 221.0 lb

## 2022-04-17 DIAGNOSIS — Z3202 Encounter for pregnancy test, result negative: Secondary | ICD-10-CM

## 2022-04-17 DIAGNOSIS — Z113 Encounter for screening for infections with a predominantly sexual mode of transmission: Secondary | ICD-10-CM

## 2022-04-17 DIAGNOSIS — N898 Other specified noninflammatory disorders of vagina: Secondary | ICD-10-CM

## 2022-04-17 DIAGNOSIS — Z3200 Encounter for pregnancy test, result unknown: Secondary | ICD-10-CM

## 2022-04-17 LAB — POCT URINE PREGNANCY: Preg Test, Ur: NEGATIVE

## 2022-04-17 NOTE — Progress Notes (Signed)
SUBJECTIVE:  21 y.o. female complains of white vaginal discharge for 3 week(s). Denies abnormal vaginal bleeding or significant pelvic pain or fever. No UTI symptoms. Denies history of known exposure to STD. Concerns for pregnancy.  Patient's last menstrual period was 03/28/2022.  OBJECTIVE:  She appears well, afebrile. Urine dipstick: not done.  ASSESSMENT:  Vaginal Discharge  Vaginal Odor Pregnancy Test negative in office today.   PLAN:  GC, chlamydia, trichomonas, BVAG, CVAG probe sent to lab. Treatment: To be determined once lab results are received ROV prn if symptoms persist or worsen.   Patient also request blood work d/t unprotected sex.

## 2022-04-18 ENCOUNTER — Other Ambulatory Visit: Payer: Self-pay

## 2022-04-18 ENCOUNTER — Encounter: Payer: Self-pay | Admitting: Obstetrics and Gynecology

## 2022-04-18 LAB — CERVICOVAGINAL ANCILLARY ONLY
Bacterial Vaginitis (gardnerella): POSITIVE — AB
Candida Glabrata: NEGATIVE
Candida Vaginitis: NEGATIVE
Chlamydia: NEGATIVE
Comment: NEGATIVE
Comment: NEGATIVE
Comment: NEGATIVE
Comment: NEGATIVE
Comment: NEGATIVE
Comment: NORMAL
Neisseria Gonorrhea: NEGATIVE
Trichomonas: NEGATIVE

## 2022-04-18 LAB — HEPATITIS C ANTIBODY: Hep C Virus Ab: NONREACTIVE

## 2022-04-18 LAB — HEPATITIS B SURFACE ANTIGEN: Hepatitis B Surface Ag: NEGATIVE

## 2022-04-18 LAB — HIV ANTIBODY (ROUTINE TESTING W REFLEX): HIV Screen 4th Generation wRfx: NONREACTIVE

## 2022-04-18 LAB — RPR: RPR Ser Ql: NONREACTIVE

## 2022-04-18 MED ORDER — METRONIDAZOLE 0.75 % VA GEL: 1.0000 | Freq: Two times a day (BID) | VAGINAL | 0 refills | Status: DC

## 2022-04-18 NOTE — Progress Notes (Signed)
Pt is aware of vaginal swab results, requests metrogel for +bv, sent per protocol

## 2022-05-15 ENCOUNTER — Other Ambulatory Visit: Payer: Self-pay | Admitting: Podiatry

## 2022-05-15 DIAGNOSIS — S90852D Superficial foreign body, left foot, subsequent encounter: Secondary | ICD-10-CM

## 2022-06-11 ENCOUNTER — Other Ambulatory Visit (HOSPITAL_COMMUNITY)
Admission: RE | Admit: 2022-06-11 | Discharge: 2022-06-11 | Disposition: A | Discharge status: Home / Self Care | Payer: Medicaid Other | Source: Ambulatory Visit | Attending: Obstetrics | Admitting: Obstetrics

## 2022-06-11 ENCOUNTER — Ambulatory Visit (INDEPENDENT_AMBULATORY_CARE_PROVIDER_SITE_OTHER): Payer: Medicaid Other

## 2022-06-11 DIAGNOSIS — R110 Nausea: Secondary | ICD-10-CM

## 2022-06-11 DIAGNOSIS — N898 Other specified noninflammatory disorders of vagina: Secondary | ICD-10-CM | POA: Diagnosis present

## 2022-06-11 DIAGNOSIS — Z113 Encounter for screening for infections with a predominantly sexual mode of transmission: Secondary | ICD-10-CM

## 2022-06-11 LAB — POCT URINE PREGNANCY: Preg Test, Ur: NEGATIVE

## 2022-06-11 NOTE — Progress Notes (Signed)
Chart reviewed - agree with CMA/RN documentation.  ° °

## 2022-06-11 NOTE — Progress Notes (Signed)
Patient presents for STD screening.  SUBJECTIVE:  21 y.o. female complains of white, malodorous, and thin vaginal discharge for 4 day(s). Also complains of vaginal irritation. Denies abnormal vaginal bleeding or significant pelvic pain or fever. No UTI symptoms. Denies history of known exposure to STD.  No LMP recorded.  OBJECTIVE:  She appears well, afebrile. Urine dipstick: not done.  ASSESSMENT:  Vaginal Discharge  Vaginal Odor   PLAN:  GC, chlamydia, trichomonas, BVAG, CVAG probe sent to lab. Treatment: To be determined once lab results are received ROV prn if symptoms persist or worsen.   Patient also request for a UPT today. Patient states that a couple of weeks ago her patch came off in the shower, so she went about a week without wearing a patch.

## 2022-06-12 ENCOUNTER — Other Ambulatory Visit: Payer: Self-pay | Admitting: Obstetrics and Gynecology

## 2022-06-12 LAB — CERVICOVAGINAL ANCILLARY ONLY
Bacterial Vaginitis (gardnerella): POSITIVE — AB
Candida Glabrata: NEGATIVE
Candida Vaginitis: NEGATIVE
Chlamydia: NEGATIVE
Comment: NEGATIVE
Comment: NEGATIVE
Comment: NEGATIVE
Comment: NEGATIVE
Comment: NEGATIVE
Comment: NORMAL
Neisseria Gonorrhea: NEGATIVE
Trichomonas: NEGATIVE

## 2022-06-12 LAB — HEPATITIS B SURFACE ANTIGEN: Hepatitis B Surface Ag: NEGATIVE

## 2022-06-12 LAB — RPR: RPR Ser Ql: NONREACTIVE

## 2022-06-12 LAB — HEPATITIS C ANTIBODY: Hep C Virus Ab: NONREACTIVE

## 2022-06-12 LAB — HIV ANTIBODY (ROUTINE TESTING W REFLEX): HIV Screen 4th Generation wRfx: NONREACTIVE

## 2022-06-13 ENCOUNTER — Other Ambulatory Visit: Payer: Self-pay | Admitting: Family Medicine

## 2022-06-13 DIAGNOSIS — N898 Other specified noninflammatory disorders of vagina: Secondary | ICD-10-CM

## 2022-06-13 MED ORDER — METRONIDAZOLE 500 MG PO TABS: 500.0000 mg | ORAL_TABLET | Freq: Two times a day (BID) | ORAL | 0 refills | Status: DC

## 2022-06-17 ENCOUNTER — Other Ambulatory Visit: Payer: Self-pay

## 2022-06-17 DIAGNOSIS — N760 Acute vaginitis: Secondary | ICD-10-CM

## 2022-06-17 MED ORDER — METRONIDAZOLE 0.75 % VA GEL: 1.0000 | Freq: Two times a day (BID) | VAGINAL | 0 refills | Status: DC

## 2022-06-25 ENCOUNTER — Ambulatory Visit: Payer: Medicaid Other

## 2022-06-26 ENCOUNTER — Ambulatory Visit (INDEPENDENT_AMBULATORY_CARE_PROVIDER_SITE_OTHER): Payer: Medicaid Other | Admitting: *Deleted

## 2022-06-26 ENCOUNTER — Other Ambulatory Visit (HOSPITAL_COMMUNITY)
Admission: RE | Admit: 2022-06-26 | Discharge: 2022-06-26 | Disposition: A | Discharge status: Home / Self Care | Payer: Medicaid Other | Source: Ambulatory Visit | Attending: Obstetrics and Gynecology | Admitting: Obstetrics and Gynecology

## 2022-06-26 VITALS — BP 113/79 | HR 67

## 2022-06-26 DIAGNOSIS — Z3200 Encounter for pregnancy test, result unknown: Secondary | ICD-10-CM

## 2022-06-26 DIAGNOSIS — Z3202 Encounter for pregnancy test, result negative: Secondary | ICD-10-CM | POA: Diagnosis not present

## 2022-06-26 DIAGNOSIS — Z113 Encounter for screening for infections with a predominantly sexual mode of transmission: Secondary | ICD-10-CM | POA: Insufficient documentation

## 2022-06-26 LAB — POCT URINE PREGNANCY: Preg Test, Ur: NEGATIVE

## 2022-06-26 NOTE — Progress Notes (Signed)
Ms. Godown presents today for UPT. She has no unusual complaints. LMP: October/ unknown date    OBJECTIVE: Appears well, in no apparent distress.  OB History     Gravida  2   Para  2   Term  2   Preterm      AB      Living  2      SAB      IAB      Ectopic      Multiple  0   Live Births  2          Home UPT Result: NA In-Office UPT result: Negative I have reviewed the patient's medical, obstetrical, social, and family histories, and medications.   ASSESSMENT: Negative pregnancy test  PLAN Prenatal care to be completed at:  SUBJECTIVE:  21 y.o. female who desires a STI screen. Denies abnormal vaginal discharge, bleeding or significant pelvic pain. No UTI symptoms. Denies history of known exposure to STD.  LMP: October/ unknown date  OBJECTIVE:  She appears well.   ASSESSMENT:  STI Screen   PLAN:  Pt offered STI blood screening-declined GC, chlamydia, and trichomonas probe sent to lab.  Treatment: To be determined once lab results are received.  Pt follow up as needed.

## 2022-06-27 LAB — CERVICOVAGINAL ANCILLARY ONLY
Bacterial Vaginitis (gardnerella): NEGATIVE
Candida Glabrata: NEGATIVE
Candida Vaginitis: NEGATIVE
Chlamydia: POSITIVE — AB
Comment: NEGATIVE
Comment: NEGATIVE
Comment: NEGATIVE
Comment: NEGATIVE
Comment: NEGATIVE
Comment: NORMAL
Neisseria Gonorrhea: NEGATIVE
Trichomonas: NEGATIVE

## 2022-06-28 ENCOUNTER — Encounter: Payer: Self-pay | Admitting: Obstetrics and Gynecology

## 2022-06-28 ENCOUNTER — Other Ambulatory Visit: Payer: Self-pay | Admitting: Emergency Medicine

## 2022-06-28 MED ORDER — DOXYCYCLINE HYCLATE 100 MG PO CAPS: 100.0000 mg | ORAL_CAPSULE | Freq: Two times a day (BID) | ORAL | 0 refills | Status: DC

## 2022-06-28 NOTE — Progress Notes (Signed)
Rx for Chlamydia. Treated per protocol.

## 2022-07-09 ENCOUNTER — Other Ambulatory Visit (HOSPITAL_COMMUNITY)
Admission: RE | Admit: 2022-07-09 | Discharge: 2022-07-09 | Disposition: A | Discharge status: Home / Self Care | Payer: Medicaid Other | Source: Ambulatory Visit | Attending: Obstetrics and Gynecology | Admitting: Obstetrics and Gynecology

## 2022-07-09 ENCOUNTER — Ambulatory Visit (INDEPENDENT_AMBULATORY_CARE_PROVIDER_SITE_OTHER): Payer: Medicaid Other | Admitting: *Deleted

## 2022-07-09 DIAGNOSIS — N898 Other specified noninflammatory disorders of vagina: Secondary | ICD-10-CM | POA: Insufficient documentation

## 2022-07-09 NOTE — Progress Notes (Signed)
SUBJECTIVE:  21 y.o. female complains of malodorous vaginal discharge and irritation. Pt would like full swab tested today- self swab to be obtained.  Denies abnormal vaginal bleeding or significant pelvic pain or fever. No UTI symptoms. Was recently treated for STD.  No LMP recorded.  OBJECTIVE:  She appears well, afebrile.   ASSESSMENT:  Vaginal Irritation Vaginal Odor   PLAN:   GC, chlamydia, trichomonas, BVAG, CVAG probe sent to lab. Treatment: To be determined once lab results are received ROV prn if symptoms persist or worsen.

## 2022-07-09 NOTE — Progress Notes (Signed)
Patient was assessed and managed by nursing staff during this encounter. I have reviewed the chart and agree with the documentation and plan. I have also made any necessary editorial changes.  Griffin Basil, MD 07/09/2022 3:50 PM

## 2022-07-10 ENCOUNTER — Other Ambulatory Visit: Payer: Self-pay | Admitting: Obstetrics and Gynecology

## 2022-07-10 LAB — CERVICOVAGINAL ANCILLARY ONLY
Bacterial Vaginitis (gardnerella): NEGATIVE
Candida Glabrata: NEGATIVE
Candida Vaginitis: NEGATIVE
Chlamydia: POSITIVE — AB
Comment: NEGATIVE
Comment: NEGATIVE
Comment: NEGATIVE
Comment: NEGATIVE
Comment: NEGATIVE
Comment: NORMAL
Neisseria Gonorrhea: NEGATIVE
Trichomonas: NEGATIVE

## 2022-07-15 ENCOUNTER — Other Ambulatory Visit: Payer: Self-pay | Admitting: Emergency Medicine

## 2022-07-15 MED ORDER — DOXYCYCLINE HYCLATE 100 MG PO CAPS: 100.0000 mg | ORAL_CAPSULE | Freq: Two times a day (BID) | ORAL | 0 refills | Status: DC

## 2022-07-15 NOTE — Progress Notes (Signed)
Pt re treated for Chlamydia. Completed abx course, but had sex before 7 day post-abx course. Likely reinfection. Instructed to abstain 7 days after tx completion, and partner to be retreated.

## 2022-07-31 ENCOUNTER — Encounter (HOSPITAL_COMMUNITY): Payer: Self-pay | Admitting: Emergency Medicine

## 2022-07-31 ENCOUNTER — Inpatient Hospital Stay (HOSPITAL_COMMUNITY)
Admission: EM | Admit: 2022-07-31 | Discharge: 2022-07-31 | Disposition: A | Discharge status: Home / Self Care | Payer: Medicaid Other | Attending: Obstetrics & Gynecology | Admitting: Obstetrics & Gynecology

## 2022-07-31 DIAGNOSIS — N920 Excessive and frequent menstruation with regular cycle: Secondary | ICD-10-CM | POA: Diagnosis present

## 2022-07-31 DIAGNOSIS — N939 Abnormal uterine and vaginal bleeding, unspecified: Secondary | ICD-10-CM

## 2022-07-31 LAB — CBC WITH DIFFERENTIAL/PLATELET
Abs Immature Granulocytes: 0.02 10*3/uL (ref 0.00–0.07)
Basophils Absolute: 0 10*3/uL (ref 0.0–0.1)
Basophils Relative: 0 %
Eosinophils Absolute: 0.1 10*3/uL (ref 0.0–0.5)
Eosinophils Relative: 1 %
HCT: 37.9 % (ref 36.0–46.0)
Hemoglobin: 12.6 g/dL (ref 12.0–15.0)
Immature Granulocytes: 0 %
Lymphocytes Relative: 35 %
Lymphs Abs: 3.1 10*3/uL (ref 0.7–4.0)
MCH: 29.5 pg (ref 26.0–34.0)
MCHC: 33.2 g/dL (ref 30.0–36.0)
MCV: 88.8 fL (ref 80.0–100.0)
Monocytes Absolute: 0.7 10*3/uL (ref 0.1–1.0)
Monocytes Relative: 8 %
Neutro Abs: 4.9 10*3/uL (ref 1.7–7.7)
Neutrophils Relative %: 56 %
Platelets: 281 10*3/uL (ref 150–400)
RBC: 4.27 MIL/uL (ref 3.87–5.11)
RDW: 12.6 % (ref 11.5–15.5)
WBC: 8.9 10*3/uL (ref 4.0–10.5)
nRBC: 0 % (ref 0.0–0.2)

## 2022-07-31 LAB — COMPREHENSIVE METABOLIC PANEL
ALT: 16 U/L (ref 0–44)
AST: 21 U/L (ref 15–41)
Albumin: 3.9 g/dL (ref 3.5–5.0)
Alkaline Phosphatase: 81 U/L (ref 38–126)
Anion gap: 7 (ref 5–15)
BUN: 13 mg/dL (ref 6–20)
CO2: 25 mmol/L (ref 22–32)
Calcium: 9.2 mg/dL (ref 8.9–10.3)
Chloride: 109 mmol/L (ref 98–111)
Creatinine, Ser: 0.74 mg/dL (ref 0.44–1.00)
GFR, Estimated: >60 mL/min (ref >60)
Glucose, Bld: 88 mg/dL (ref 70–99)
Potassium: 4.3 mmol/L (ref 3.5–5.1)
Sodium: 141 mmol/L (ref 135–145)
Total Bilirubin: 0.2 mg/dL — ABNORMAL LOW (ref 0.3–1.2)
Total Protein: 7.4 g/dL (ref 6.5–8.1)

## 2022-07-31 MED ORDER — IBUPROFEN 800 MG PO TABS: 800.0000 mg | ORAL_TABLET | Freq: Three times a day (TID) | ORAL | 1 refills | Status: DC | PRN

## 2022-07-31 MED ORDER — MEGESTROL ACETATE 40 MG PO TABS: 40.0000 mg | ORAL_TABLET | Freq: Two times a day (BID) | ORAL | 0 refills | Status: DC

## 2022-07-31 NOTE — ED Triage Notes (Addendum)
Pt endorses unprotected sex within last month. States having heavy bleeding- soaking pads every hour. Reports RL pelvic pain and migraines behind R eye. G 2 P2

## 2022-07-31 NOTE — MAU Provider Note (Signed)
History     CSN: [id number]  Arrival date and time: 07/31/22 1726   Event Date/Time   First Provider Initiated Contact with Patient 07/31/22 2157      Chief Complaint  Patient presents with   Vaginal Bleeding   HPI Briana Curry is a 21 y.o.  female who presents today for Vaginal Bleeding.  She reports she started her period on Monday and has been so heavy she has been saturating through her clothes.  She states she has also been experiencing migraines and abdominal pain.  She reports she has been taking ibuprofen '800mg'$  with relief of her pain.  She reports she was using patches, for birth control, but discontinued them about 1-2 months ago. She reports her last period "was not like this." She reports last sexual encounter was Sunday and was without pain or discomfort.    Pertinent Gynecological History: Menses: regular every month without intermenstrual spotting Bleeding: dysfunctional uterine bleeding Contraception: none Blood transfusions: none Sexually transmitted diseases: past history: CT 3 weeks Previous GYN Procedures:  N/A   Last mammogram:  N/A  Date: N/A Last pap:  Due  Date: N/A  Past Medical History:  Diagnosis Date   Anemia    Asthma    Broken arm    Iron deficiency    Multiple allergies     Past Surgical History:  Procedure Laterality Date   ARM HARDWARE REMOVAL      Family History  Problem Relation Age of Onset   Asthma Mother    Asthma Sister    Eczema Sister    Urticaria Sister    Asthma Brother    Eczema Brother     Social History   Tobacco Use   Smoking status: Former    Types: Cigars    Quit date: 03/2021    Years since quitting: 1.3   Smokeless tobacco: Never  Vaping Use   Vaping Use: Never used  Substance Use Topics   Alcohol use: Not Currently    Comment: not while preg   Drug use: No    Allergies:  Allergies  Allergen Reactions   Shrimp [Shellfish Allergy] Anaphylaxis    Pt states she is also allergic to roaches     Lactose Intolerance (Gi) Diarrhea   Other Rash    [employer] sweet and sour sauce caused rash and seafood    Medications Prior to Admission  Medication Sig Dispense Refill Last Dose   acetaminophen (TYLENOL) 325 MG tablet Take 2 tablets (650 mg total) by mouth every 4 (four) hours as needed (for pain scale < 4). (Patient not taking: Reported on 04/02/2022) 60 tablet 0    doxycycline (VIBRAMYCIN) 100 MG capsule Take 1 capsule (100 mg total) by mouth 2 (two) times daily. 14 capsule 0    doxycycline (VIBRAMYCIN) 100 MG capsule Take 1 capsule (100 mg total) by mouth 2 (two) times daily. 14 capsule 0    ibuprofen (ADVIL) 600 MG tablet Take 1 tablet (600 mg total) by mouth every 6 (six) hours. 30 tablet 0    metroNIDAZOLE (FLAGYL) 500 MG tablet Take 1 tablet (500 mg total) by mouth 2 (two) times daily. 14 tablet 0    metroNIDAZOLE (METROGEL VAGINAL) 0.75 % vaginal gel Place 1 Applicatorful vaginally 2 (two) times daily. 70 g 0    norelgestromin-ethinyl estradiol Marilu Favre) 150-35 MCG/24HR transdermal patch Place 1 patch onto the skin once a week. 3 patch 12    Prenatal Vit-Fe Fumarate-FA (PRENATAL PLUS VITAMIN/MINERAL) 27-1 MG TABS  Take 1 tablet by mouth daily. (Patient not taking: Reported on 04/02/2022) 90 tablet 2     Review of Systems  Constitutional:  Negative for chills and fever.  Gastrointestinal:  Positive for abdominal pain (None currently). Negative for nausea and vomiting.  Genitourinary:  Positive for vaginal bleeding. Negative for difficulty urinating, dysuria and vaginal discharge.  Neurological:  Positive for dizziness, light-headedness and headaches (Migraine-None currently).   Physical Exam   Blood pressure 119/73, pulse 75, temperature 98.2 F (36.8 C), temperature source Oral, resp. rate 18, height '5\' 2"'$  (1.575 m), last menstrual period 07/31/2022, SpO2 100 %, not currently breastfeeding.  Physical Exam Vitals reviewed.  Constitutional:      Appearance: Normal appearance. She is  obese.  HENT:     Head: Normocephalic and atraumatic.  Eyes:     Conjunctiva/sclera: Conjunctivae normal.  Cardiovascular:     Rate and Rhythm: Normal rate and regular rhythm.  Pulmonary:     Effort: Pulmonary effort is normal. No respiratory distress.     Breath sounds: Normal breath sounds.  Abdominal:     Palpations: Abdomen is soft.     Tenderness: There is no abdominal tenderness.  Musculoskeletal:     Cervical back: Normal range of motion.  Skin:    General: Skin is warm and dry.  Neurological:     Mental Status: She is alert and oriented to person, place, and time.  Psychiatric:        Mood and Affect: Mood normal.        Behavior: Behavior normal.     MAU Course  Procedures Results for orders placed or performed during the hospital encounter of 07/31/22 (from the past 24 hour(s))  CBC with Differential/Platelet     Status: None   Collection Time: 07/31/22  8:06 PM  Result Value Ref Range   WBC 8.9 4.0 - 10.5 K/uL   RBC 4.27 3.87 - 5.11 MIL/uL   Hemoglobin 12.6 12.0 - 15.0 g/dL   HCT 37.9 36.0 - 46.0 %   MCV 88.8 80.0 - 100.0 fL   MCH 29.5 26.0 - 34.0 pg   MCHC 33.2 30.0 - 36.0 g/dL   RDW 12.6 11.5 - 15.5 %   Platelets 281 150 - 400 K/uL   nRBC 0.0 0.0 - 0.2 %   Neutrophils Relative % 56 %   Neutro Abs 4.9 1.7 - 7.7 K/uL   Lymphocytes Relative 35 %   Lymphs Abs 3.1 0.7 - 4.0 K/uL   Monocytes Relative 8 %   Monocytes Absolute 0.7 0.1 - 1.0 K/uL   Eosinophils Relative 1 %   Eosinophils Absolute 0.1 0.0 - 0.5 K/uL   Basophils Relative 0 %   Basophils Absolute 0.0 0.0 - 0.1 K/uL   Immature Granulocytes 0 %   Abs Immature Granulocytes 0.02 0.00 - 0.07 K/uL  Comprehensive metabolic panel     Status: Abnormal   Collection Time: 07/31/22  8:06 PM  Result Value Ref Range   Sodium 141 135 - 145 mmol/L   Potassium 4.3 3.5 - 5.1 mmol/L   Chloride 109 98 - 111 mmol/L   CO2 25 22 - 32 mmol/L   Glucose, Bld 88 70 - 99 mg/dL   BUN 13 6 - 20 mg/dL   Creatinine, Ser  0.74 0.44 - 1.00 mg/dL   Calcium 9.2 8.9 - 10.3 mg/dL   Total Protein 7.4 6.5 - 8.1 g/dL   Albumin 3.9 3.5 - 5.0 g/dL   AST 21 15 -  41 U/L   ALT 16 0 - 44 U/L   Alkaline Phosphatase 81 38 - 126 U/L   Total Bilirubin 0.2 (L) 0.3 - 1.2 mg/dL   GFR, Estimated >60 >60 mL/min   Anion gap 7 5 - 15    MDM Exam Prescription  Assessment and Plan  21 year old female Menorrhagia   -Exam performed -Patient informed of medications to help with bleeding. -Discussed how improper usage of birth control can cause bleeding issues. -Further instructed to put her patch on! As this may help to slow heavy bleeding. -Informed that TOC would not be performed today,but she could go to office. -Message sent to office for scheduling of annual visit.  -Precautions reviewed. -Encouraged to call primary office or return to MAU if symptoms worsen or with the onset of new symptoms.  Maryann Conners 07/31/2022, 9:57 PM

## 2022-07-31 NOTE — ED Provider Triage Note (Addendum)
Emergency Medicine Provider Triage Evaluation Note  Briana Curry , a 21 y.o. female  was evaluated in triage.  Pt complains of heavy vaginal bleeding that began yesterday.  She reports soaking through tampons and pads every hour.  She also has lower right pelvic pain and dizziness.  She endorses unprotected sex within the last month.  Denies vaginal discharge, nausea, vomiting, diarrhea.  G2P2.   Review of Systems  Positive: See above Negative: See above  Physical Exam  BP (!) 111/44 (BP Location: Right Arm)   Pulse 74   Temp 98.6 F (37 C) (Oral)   Resp 18   LMP 07/31/2022   SpO2 99%  Gen:   Awake, no distress   Resp:  Normal effort  MSK:   Moves extremities without difficulty  Other:  Lower right abdominal pain with palpation   Medical Decision Making  Medically screening exam initiated at 7:42 PM.  Appropriate orders placed.  Briana Curry was informed that the remainder of the evaluation will be completed by another provider, this initial triage assessment does not replace that evaluation, and the importance of remaining in the ED until their evaluation is complete.  Spoke with Sam, midwife in MAU, who accepted patient for further workup.    Theressa Stamps R, [place] 07/31/22 1944    Pat Kocher, [place] 07/31/22 1945

## 2022-07-31 NOTE — MAU Note (Signed)
.  Briana Curry is a 21 y.o. at Unknown here in MAU reporting: dizziness, migraine behind her eye when she bends over, lower left ABD sharp pain that is intermittent, and VB that is heavy that is saturating her clothes and on the bed. Pt states she has just started her period Monday and she normally has lighter normal periods.  Onset of complaint: 2 days  Pain score: 0/10 Vitals:   07/31/22 1927  BP: (!) 111/44  Pulse: 74  Resp: 18  Temp: 98.6 F (37 C)  SpO2: 99%      Lab orders placed from triage:

## 2022-07-31 NOTE — ED Notes (Signed)
Report given to MAU nurse

## 2022-09-09 ENCOUNTER — Ambulatory Visit (INDEPENDENT_AMBULATORY_CARE_PROVIDER_SITE_OTHER): Payer: Medicaid Other

## 2022-09-09 ENCOUNTER — Other Ambulatory Visit (HOSPITAL_COMMUNITY)
Admission: RE | Admit: 2022-09-09 | Discharge: 2022-09-09 | Disposition: A | Discharge status: Home / Self Care | Payer: Medicaid Other | Source: Ambulatory Visit | Attending: Obstetrics and Gynecology | Admitting: Obstetrics and Gynecology

## 2022-09-09 VITALS — Ht 66.0 in | Wt 224.0 lb

## 2022-09-09 DIAGNOSIS — R300 Dysuria: Secondary | ICD-10-CM | POA: Diagnosis not present

## 2022-09-09 DIAGNOSIS — N898 Other specified noninflammatory disorders of vagina: Secondary | ICD-10-CM

## 2022-09-09 LAB — POCT URINALYSIS DIPSTICK
Bilirubin, UA: NEGATIVE
Blood, UA: NEGATIVE
Glucose, UA: NEGATIVE
Ketones, UA: NEGATIVE
Leukocytes, UA: NEGATIVE
Nitrite, UA: NEGATIVE
Protein, UA: NEGATIVE
Spec Grav, UA: 1.025 (ref 1.010–1.025)
Urobilinogen, UA: >=8 E.U./dL — AB
pH, UA: 6 (ref 5.0–8.0)

## 2022-09-09 NOTE — Progress Notes (Addendum)
SUBJECTIVE:  22 y.o. female complains of white and foul vaginal discharge for 10+ day(s). Denies abnormal vaginal bleeding or significant pelvic pain or fever. C/o dysuria. Denies history of known exposure to STD.  No LMP recorded.  OBJECTIVE:  She appears well, afebrile. Urine dipstick: positive for urobilinogen.  ASSESSMENT:  Vaginal Discharge  Vaginal Odor Urinary Frequency   PLAN:  GC, chlamydia, trichomonas, BVAG, CVAG probe, Urine culture sent to lab. Treatment: To be determined once lab results are received ROV prn if symptoms persist or worsen.

## 2022-09-10 LAB — CERVICOVAGINAL ANCILLARY ONLY
Bacterial Vaginitis (gardnerella): POSITIVE — AB
Candida Glabrata: NEGATIVE
Candida Vaginitis: POSITIVE — AB
Chlamydia: NEGATIVE
Comment: NEGATIVE
Comment: NEGATIVE
Comment: NEGATIVE
Comment: NEGATIVE
Comment: NEGATIVE
Comment: NORMAL
Neisseria Gonorrhea: NEGATIVE
Trichomonas: NEGATIVE

## 2022-09-11 ENCOUNTER — Other Ambulatory Visit: Payer: Self-pay | Admitting: Obstetrics and Gynecology

## 2022-09-11 LAB — URINE CULTURE: Organism ID, Bacteria: NO GROWTH

## 2022-09-12 ENCOUNTER — Other Ambulatory Visit: Payer: Self-pay | Admitting: Emergency Medicine

## 2022-09-12 MED ORDER — METRONIDAZOLE 500 MG PO TABS: 500.0000 mg | ORAL_TABLET | Freq: Two times a day (BID) | ORAL | 0 refills | Status: DC

## 2022-09-12 MED ORDER — FLUCONAZOLE 150 MG PO TABS: 150.0000 mg | ORAL_TABLET | Freq: Once | ORAL | 0 refills | Status: AC

## 2022-09-12 NOTE — Progress Notes (Signed)
Rx for BV and yeast.

## 2022-09-14 ENCOUNTER — Encounter: Payer: Self-pay | Admitting: Obstetrics

## 2022-09-23 ENCOUNTER — Encounter: Payer: Self-pay | Admitting: Obstetrics and Gynecology

## 2022-09-23 ENCOUNTER — Ambulatory Visit (INDEPENDENT_AMBULATORY_CARE_PROVIDER_SITE_OTHER): Payer: Medicaid Other | Admitting: Obstetrics and Gynecology

## 2022-09-23 ENCOUNTER — Other Ambulatory Visit (HOSPITAL_COMMUNITY)
Admission: RE | Admit: 2022-09-23 | Discharge: 2022-09-23 | Disposition: A | Discharge status: Home / Self Care | Payer: Medicaid Other | Source: Ambulatory Visit | Attending: Obstetrics and Gynecology | Admitting: Obstetrics and Gynecology

## 2022-09-23 VITALS — BP 111/71 | HR 74 | Ht 63.0 in | Wt 220.6 lb

## 2022-09-23 DIAGNOSIS — Z113 Encounter for screening for infections with a predominantly sexual mode of transmission: Secondary | ICD-10-CM

## 2022-09-23 DIAGNOSIS — Z01419 Encounter for gynecological examination (general) (routine) without abnormal findings: Secondary | ICD-10-CM | POA: Insufficient documentation

## 2022-09-23 DIAGNOSIS — Z3202 Encounter for pregnancy test, result negative: Secondary | ICD-10-CM

## 2022-09-23 DIAGNOSIS — N898 Other specified noninflammatory disorders of vagina: Secondary | ICD-10-CM

## 2022-09-23 DIAGNOSIS — Z30016 Encounter for initial prescription of transdermal patch hormonal contraceptive device: Secondary | ICD-10-CM

## 2022-09-23 LAB — POCT URINE PREGNANCY: Preg Test, Ur: NEGATIVE

## 2022-09-23 MED ORDER — NORELGESTROMIN-ETH ESTRADIOL 150-35 MCG/24HR TD PTWK: 1.0000 | MEDICATED_PATCH | TRANSDERMAL | 12 refills | Status: DC

## 2022-09-23 NOTE — Progress Notes (Signed)
Pt presents for annual exam. Pt reports having chunky, white discharge. Denies pain or discomfort. Pt would like STD testing and pregnancy test due to irregular periods. Pt currently has birth control patch, but does not use it regularly. Denies any abnormal breast changes. Pt has no other concerns at this time.

## 2022-09-23 NOTE — Progress Notes (Signed)
GYNECOLOGY ANNUAL PREVENTATIVE CARE ENCOUNTER NOTE  History:     Briana Curry is a 22 y.o. [phone] female here for a routine annual gynecologic exam.  Current complaints: Occasional irregular bleeding.   Denies abnormal vaginal bleeding, discharge, pelvic pain, problems with intercourse or other gynecologic concerns. Pt has noted multiple periods in a month with the patch; however, pt is likely using the patches incorrectly.  She notes if a patch is peeling up, she will sometimes remove the patch in its entirety and then put on a brand new patch.  Pt advised this may be cintributing to some of the irregular bleeding.  Pt again educated to use one patch to skin per week.   Gynecologic History Patient's last menstrual period was 08/30/2022 (exact date). Contraception: Ortho-Evra patches weekly Last Pap: pap done today, first pap, results pending  Obstetric History OB History  Gravida Para Term Preterm AB Living  '2 2 2     2  '$ SAB IAB Ectopic Multiple Live Births        0 2    # Outcome Date GA Lbr Len/2nd Weight Sex Delivery Anes PTL Lv  2 Term 01/09/22 [redacted]w[redacted]d/ 00:11 8 lb 8.9 oz (3.88 kg) F Vag-Spont EPI  LIV  1 Term 04/07/20 458w2d 01:29 8 lb 12.2 oz (3.975 kg) M Vag-Spont EPI  LIV    Past Medical History:  Diagnosis Date   Anemia    Asthma    Broken arm    Iron deficiency    Multiple allergies     Past Surgical History:  Procedure Laterality Date   ARM HARDWARE REMOVAL      Current Outpatient Medications on File Prior to Visit  Medication Sig Dispense Refill   acetaminophen (TYLENOL) 325 MG tablet Take 2 tablets (650 mg total) by mouth every 4 (four) hours as needed (for pain scale < 4). (Patient not taking: Reported on 04/02/2022) 60 tablet 0   ibuprofen (ADVIL) 800 MG tablet Take 1 tablet (800 mg total) by mouth every 8 (eight) hours as needed. 30 tablet 1   metroNIDAZOLE (FLAGYL) 500 MG tablet Take 1 tablet (500 mg total) by mouth 2 (two) times daily. 14 tablet 0    No current facility-administered medications on file prior to visit.    Allergies  Allergen Reactions   Shrimp [Shellfish Allergy] Anaphylaxis    Pt states she is also allergic to roaches    Lactose Intolerance (Gi) Diarrhea   Other Rash    W[employer] sweet and sour sauce caused rash and seafood    Social History:  reports that she quit smoking about 18 months ago. Her smoking use included cigars. She has never used smokeless tobacco. She reports current alcohol use. She reports that she does not use drugs.  Family History  Problem Relation Age of Onset   Asthma Mother    Asthma Sister    Eczema Sister    Urticaria Sister    Asthma Brother    Eczema Brother     The following portions of the patient's history were reviewed and updated as appropriate: allergies, current medications, past family history, past medical history, past social history, past surgical history and problem list.  Review of Systems Pertinent items noted in HPI and remainder of comprehensive ROS otherwise negative.  Physical Exam:  BP 111/71   Pulse 74   Ht '5\' 3"'$  (1.6 m)   Wt 220 lb 9.6 oz (100.1 kg)   LMP 08/30/2022 (Exact Date)  BMI 39.08 kg/m  CONSTITUTIONAL: Well-developed, obese, well-nourished female in no acute distress.  HENT:  Normocephalic, atraumatic, External right and left ear normal. Oropharynx is clear and moist EYES: Conjunctivae and EOM are normal.  NECK: Normal range of motion, supple, no masses.  Normal thyroid.  SKIN: Skin is warm and dry. No rash noted. Not diaphoretic. No erythema. No pallor. MUSCULOSKELETAL: Normal range of motion. No tenderness.  No cyanosis, clubbing, or edema.  2+ distal pulses. NEUROLOGIC: Alert and oriented to person, place, and time. Normal reflexes, muscle tone coordination.  PSYCHIATRIC: Normal mood and affect. Normal behavior. Normal judgment and thought content. CARDIOVASCULAR: Normal heart rate noted, regular rhythm RESPIRATORY: Clear to auscultation  bilaterally. Effort and breath sounds normal, no problems with respiration noted. BREASTS: deferred ABDOMEN: Soft, no distention noted.  No tenderness, rebound or guarding.  PELVIC: Normal appearing external genitalia and urethral meatus; normal appearing vaginal mucosa and cervix.  No abnormal discharge noted.  Pap smear obtained.  Vaginal swab obtained.  Normal uterine size, no other palpable masses, no uterine or adnexal tenderness.  Performed in the presence of a chaperone.   Assessment and Plan:    1. Encounter for gynecological examination without abnormal finding Normal annual exam - Cytology - PAP( [facility]) - POCT urine pregnancy - Cervicovaginal ancillary only( [place])  2. Routine screening for STI (sexually transmitted infection) Per pt request  - Hepatitis B surface antigen - Hepatitis C antibody - HIV Antibody (routine testing w rflx) - RPR  3. Vaginal discharge Will check with vaginal swab  Will follow up results of pap smear and manage accordingly. Routine preventative health maintenance measures emphasized. Please refer to After Visit Summary for other counseling recommendations.     F/u in 1 year or prn Refill of contraceptive patches sent.  Lynnda Shields, MD, [place] [facility], [place]

## 2022-09-24 LAB — CERVICOVAGINAL ANCILLARY ONLY
Bacterial Vaginitis (gardnerella): NEGATIVE
Candida Glabrata: NEGATIVE
Candida Vaginitis: NEGATIVE
Chlamydia: NEGATIVE
Comment: NEGATIVE
Comment: NEGATIVE
Comment: NEGATIVE
Comment: NEGATIVE
Comment: NEGATIVE
Comment: NORMAL
Neisseria Gonorrhea: NEGATIVE
Trichomonas: NEGATIVE

## 2022-09-24 LAB — HIV ANTIBODY (ROUTINE TESTING W REFLEX): HIV Screen 4th Generation wRfx: NONREACTIVE

## 2022-09-24 LAB — HEPATITIS C ANTIBODY: Hep C Virus Ab: NONREACTIVE

## 2022-09-24 LAB — HEPATITIS B SURFACE ANTIGEN: Hepatitis B Surface Ag: NEGATIVE

## 2022-09-24 LAB — RPR: RPR Ser Ql: NONREACTIVE

## 2022-09-26 LAB — CYTOLOGY - PAP
Diagnosis: NEGATIVE
Diagnosis: REACTIVE

## 2022-10-06 ENCOUNTER — Encounter: Payer: Self-pay | Admitting: Obstetrics and Gynecology

## 2022-10-06 ENCOUNTER — Other Ambulatory Visit: Payer: Self-pay | Admitting: Obstetrics

## 2022-10-08 ENCOUNTER — Ambulatory Visit (INDEPENDENT_AMBULATORY_CARE_PROVIDER_SITE_OTHER): Payer: Medicaid Other | Admitting: Obstetrics

## 2022-10-08 ENCOUNTER — Ambulatory Visit: Payer: Medicaid Other | Admitting: Obstetrics

## 2022-10-08 ENCOUNTER — Encounter: Payer: Self-pay | Admitting: Obstetrics

## 2022-10-08 ENCOUNTER — Other Ambulatory Visit (HOSPITAL_COMMUNITY)
Admission: RE | Admit: 2022-10-08 | Discharge: 2022-10-08 | Disposition: A | Discharge status: Home / Self Care | Payer: Medicaid Other | Source: Ambulatory Visit | Attending: Obstetrics | Admitting: Obstetrics

## 2022-10-08 DIAGNOSIS — N946 Dysmenorrhea, unspecified: Secondary | ICD-10-CM

## 2022-10-08 DIAGNOSIS — N898 Other specified noninflammatory disorders of vagina: Secondary | ICD-10-CM

## 2022-10-08 DIAGNOSIS — Z3045 Encounter for surveillance of transdermal patch hormonal contraceptive device: Secondary | ICD-10-CM

## 2022-10-08 DIAGNOSIS — N760 Acute vaginitis: Secondary | ICD-10-CM | POA: Diagnosis not present

## 2022-10-08 DIAGNOSIS — B3731 Acute candidiasis of vulva and vagina: Secondary | ICD-10-CM

## 2022-10-08 DIAGNOSIS — B9689 Other specified bacterial agents as the cause of diseases classified elsewhere: Secondary | ICD-10-CM

## 2022-10-08 DIAGNOSIS — E569 Vitamin deficiency, unspecified: Secondary | ICD-10-CM

## 2022-10-08 MED ORDER — NORELGESTROMIN-ETH ESTRADIOL 150-35 MCG/24HR TD PTWK: 1.0000 | MEDICATED_PATCH | TRANSDERMAL | 12 refills | Status: DC

## 2022-10-08 MED ORDER — METRONIDAZOLE 0.75 % VA GEL: 1.0000 | Freq: Two times a day (BID) | VAGINAL | 6 refills | Status: DC

## 2022-10-08 MED ORDER — FLUCONAZOLE 200 MG PO TABS: 200.0000 mg | ORAL_TABLET | ORAL | 6 refills | Status: DC

## 2022-10-08 MED ORDER — VITAFOL ULTRA 29-0.6-0.4-200 MG PO CAPS: 1.0000 | ORAL_CAPSULE | Freq: Every day | ORAL | 4 refills | Status: DC

## 2022-10-08 MED ORDER — IBUPROFEN 800 MG PO TABS: 800.0000 mg | ORAL_TABLET | Freq: Three times a day (TID) | ORAL | 5 refills | Status: DC | PRN

## 2022-10-08 NOTE — Progress Notes (Unsigned)
Patient ID: Briana Curry, female   DOB: [birth year], 22 y.o.   MRN: [id number]  Chief Complaint  Patient presents with   Vaginal Discharge    HPI Briana Curry is a 22 y.o. female.  History of chronic BV and yeast.  Presents today for follow up with complaints of vaginal discharge with fishy odor. HPI  Past Medical History:  Diagnosis Date   Anemia    Asthma    Broken arm    Iron deficiency    Multiple allergies     Past Surgical History:  Procedure Laterality Date   ARM HARDWARE REMOVAL      Family History  Problem Relation Age of Onset   Asthma Mother    Asthma Sister    Eczema Sister    Urticaria Sister    Asthma Brother    Eczema Brother     Social History Social History   Tobacco Use   Smoking status: Former    Types: Cigars    Quit date: 03/2021    Years since quitting: 1.5   Smokeless tobacco: Never  Vaping Use   Vaping Use: Never used  Substance Use Topics   Alcohol use: Yes    Comment: social   Drug use: No    Allergies  Allergen Reactions   Lactose Intolerance (Gi) Diarrhea   Other Rash    Wendy's sweet and sour sauce caused rash and seafood    Current Outpatient Medications  Medication Sig Dispense Refill   fluconazole (DIFLUCAN) 200 MG tablet Take 1 tablet (200 mg total) by mouth every 3 (three) days. 3 tablet 6   metroNIDAZOLE (METROGEL) 0.75 % vaginal gel Place 1 Applicatorful vaginally 2 (two) times daily. Use after the period ends, monthly, for 6 months. 70 g 6   Prenat-Fe Poly-Methfol-FA-DHA (VITAFOL ULTRA) 29-0.6-0.4-200 MG CAPS Take 1 capsule by mouth daily before breakfast. 90 capsule 4   acetaminophen (TYLENOL) 325 MG tablet Take 2 tablets (650 mg total) by mouth every 4 (four) hours as needed (for pain scale < 4). (Patient not taking: Reported on 04/02/2022) 60 tablet 0   ibuprofen (ADVIL) 800 MG tablet Take 1 tablet (800 mg total) by mouth every 8 (eight) hours as needed. 30 tablet 5   norelgestromin-ethinyl estradiol Marilu Favre)  150-35 MCG/24HR transdermal patch Place 1 patch onto the skin once a week. 3 patch 12   No current facility-administered medications for this visit.    Review of Systems Review of Systems Constitutional: negative for fatigue and weight loss Respiratory: negative for cough and wheezing Cardiovascular: negative for chest pain, fatigue and palpitations Gastrointestinal: negative for abdominal pain and change in bowel habits Genitourinary:positive for vaginal discharge with odor Integument/breast: negative for nipple discharge Musculoskeletal:negative for myalgias Neurological: negative for gait problems and tremors Behavioral/Psych: negative for abusive relationship, depression Endocrine: negative for temperature intolerance      Last menstrual period 08/30/2022, not currently breastfeeding.  Physical Exam Physical Exam General:   Alert and no distress  Skin:   no rash or abnormalities  Lungs:   clear to auscultation bilaterally  Heart:   regular rate and rhythm, S1, S2 normal, no murmur, click, rub or gallop  Breasts:   normal without suspicious masses, skin or nipple changes or axillary nodes  Abdomen:  normal findings: no organomegaly, soft, non-tender and no hernia  Pelvis:  External genitalia: normal general appearance Urinary system: urethral meatus normal and bladder without fullness, nontender Vaginal: normal without tenderness, induration or masses Cervix: normal appearance  Adnexa: normal bimanual exam Uterus: anteverted and non-tender, normal size    I have spent a total of 20 minutes of face-to-face and non-face-to-face time, excluding clinical staff time, reviewing notes and preparing to see patient, ordering tests and/or medications, and counseling the patient.   Data Reviewed Labs  Assessment     1. Vaginal discharge Rx: - Mycoplasma / ureaplasma culture - Cervicovaginal ancillary only  2. Vaginal odor  3. BV (bacterial vaginosis) Rx: - metroNIDAZOLE  (METROGEL) 0.75 % vaginal gel; Place 1 Applicatorful vaginally 2 (two) times daily. Use after the period ends, monthly, for 6 months.  Dispense: 70 g; Refill: 6  4. Candida vaginitis Rx: - fluconazole (DIFLUCAN) 200 MG tablet; Take 1 tablet (200 mg total) by mouth every 3 (three) days.  Dispense: 3 tablet; Refill: 6  5. Dysmenorrhea Rx: - ibuprofen (ADVIL) 800 MG tablet; Take 1 tablet (800 mg total) by mouth every 8 (eight) hours as needed.  Dispense: 30 tablet; Refill: 5  6. Encounter for surveillance of transdermal patch hormonal contraceptive device Rx: - norelgestromin-ethinyl estradiol Marilu Favre) 150-35 MCG/24HR transdermal patch; Place 1 patch onto the skin once a week.  Dispense: 3 patch; Refill: 12  7. Vitamin deficiency Rx: - Prenat-Fe Poly-Methfol-FA-DHA (VITAFOL ULTRA) 29-0.6-0.4-200 MG CAPS; Take 1 capsule by mouth daily before breakfast.  Dispense: 90 capsule; Refill: 4     Plan   Follow up in 1 year  Orders Placed This Encounter  Procedures   Mycoplasma / ureaplasma culture   Meds ordered this encounter  Medications   ibuprofen (ADVIL) 800 MG tablet    Sig: Take 1 tablet (800 mg total) by mouth every 8 (eight) hours as needed.    Dispense:  30 tablet    Refill:  5   norelgestromin-ethinyl estradiol Marilu Favre) 150-35 MCG/24HR transdermal patch    Sig: Place 1 patch onto the skin once a week.    Dispense:  3 patch    Refill:  12   metroNIDAZOLE (METROGEL) 0.75 % vaginal gel    Sig: Place 1 Applicatorful vaginally 2 (two) times daily. Use after the period ends, monthly, for 6 months.    Dispense:  70 g    Refill:  6   fluconazole (DIFLUCAN) 200 MG tablet    Sig: Take 1 tablet (200 mg total) by mouth every 3 (three) days.    Dispense:  3 tablet    Refill:  6   Prenat-Fe Poly-Methfol-FA-DHA (VITAFOL ULTRA) 29-0.6-0.4-200 MG CAPS    Sig: Take 1 capsule by mouth daily before breakfast.    Dispense:  90 capsule    Refill:  4    Shelly Bombard, MD 10/08/2022 3:41  PM

## 2022-10-08 NOTE — Progress Notes (Signed)
22 y.o presents for recurrent BV, vaginal discharge, odor, itching, Pt states that it happens after every period or sexual intercourse.

## 2022-10-09 LAB — CERVICOVAGINAL ANCILLARY ONLY
Bacterial Vaginitis (gardnerella): POSITIVE — AB
Candida Glabrata: NEGATIVE
Candida Vaginitis: POSITIVE — AB
Chlamydia: NEGATIVE
Comment: NEGATIVE
Comment: NEGATIVE
Comment: NEGATIVE
Comment: NEGATIVE
Comment: NEGATIVE
Comment: NORMAL
Neisseria Gonorrhea: NEGATIVE
Trichomonas: NEGATIVE

## 2022-11-04 ENCOUNTER — Ambulatory Visit (INDEPENDENT_AMBULATORY_CARE_PROVIDER_SITE_OTHER): Payer: Medicaid Other

## 2022-11-04 ENCOUNTER — Other Ambulatory Visit (HOSPITAL_COMMUNITY)
Admission: RE | Admit: 2022-11-04 | Discharge: 2022-11-04 | Disposition: A | Discharge status: Home / Self Care | Payer: Medicaid Other | Source: Ambulatory Visit | Attending: Obstetrics and Gynecology | Admitting: Obstetrics and Gynecology

## 2022-11-04 VITALS — BP 110/78 | HR 80 | Wt 214.2 lb

## 2022-11-04 DIAGNOSIS — N898 Other specified noninflammatory disorders of vagina: Secondary | ICD-10-CM | POA: Diagnosis present

## 2022-11-04 NOTE — Progress Notes (Signed)
SUBJECTIVE:  22 y.o. female complains of white vaginal discharge x 1 week. Denies bleeding or significant pelvic pain or fever.Denies history of known exposure to STD.  LMP: 10/25/22   OBJECTIVE:  She appears alert, well appearing, in no apparent distress Urine dipstick: not done.  ASSESSMENT:  Vaginal Discharge    PLAN:  GC, chlamydia, trichomonas, BVAG, CVAG probe and urine culture sent to lab. Treatment: To be determined once lab results are received ROV prn if symptoms persist or worsen.

## 2022-11-05 LAB — CERVICOVAGINAL ANCILLARY ONLY
Bacterial Vaginitis (gardnerella): NEGATIVE
Candida Glabrata: NEGATIVE
Candida Vaginitis: NEGATIVE
Chlamydia: NEGATIVE
Comment: NEGATIVE
Comment: NEGATIVE
Comment: NEGATIVE
Comment: NEGATIVE
Comment: NEGATIVE
Comment: NORMAL
Neisseria Gonorrhea: NEGATIVE
Trichomonas: NEGATIVE

## 2022-11-25 ENCOUNTER — Ambulatory Visit: Payer: Medicaid Other

## 2022-12-03 ENCOUNTER — Ambulatory Visit (INDEPENDENT_AMBULATORY_CARE_PROVIDER_SITE_OTHER): Payer: Medicaid Other

## 2022-12-03 ENCOUNTER — Other Ambulatory Visit (HOSPITAL_COMMUNITY)
Admission: RE | Admit: 2022-12-03 | Discharge: 2022-12-03 | Disposition: A | Discharge status: Home / Self Care | Payer: Medicaid Other | Source: Ambulatory Visit | Attending: Obstetrics and Gynecology | Admitting: Obstetrics and Gynecology

## 2022-12-03 DIAGNOSIS — N898 Other specified noninflammatory disorders of vagina: Secondary | ICD-10-CM | POA: Insufficient documentation

## 2022-12-03 DIAGNOSIS — Z113 Encounter for screening for infections with a predominantly sexual mode of transmission: Secondary | ICD-10-CM | POA: Insufficient documentation

## 2022-12-03 DIAGNOSIS — N926 Irregular menstruation, unspecified: Secondary | ICD-10-CM

## 2022-12-03 NOTE — Progress Notes (Signed)
SUBJECTIVE:  22 y.o. female complains of vaginal irritation for 2 week(s). Also complains of having abnormal vaginal bleeding or significant pelvic pain or fever. No UTI symptoms. Denies history of known exposure to STD.  Patient's last menstrual period was 11/21/2022.  OBJECTIVE:  She appears well, afebrile. Urine dipstick: not done.  ASSESSMENT:  Vaginal Discharge  Vaginal Odor   PLAN:  GC, chlamydia, trichomonas, BVAG, CVAG probe sent to lab. Treatment: To be determined once lab results are received ROV prn if symptoms persist or worsen.

## 2022-12-04 LAB — HIV ANTIBODY (ROUTINE TESTING W REFLEX): HIV Screen 4th Generation wRfx: NONREACTIVE

## 2022-12-04 LAB — CERVICOVAGINAL ANCILLARY ONLY
Bacterial Vaginitis (gardnerella): NEGATIVE
Candida Glabrata: NEGATIVE
Candida Vaginitis: NEGATIVE
Chlamydia: NEGATIVE
Comment: NEGATIVE
Comment: NEGATIVE
Comment: NEGATIVE
Comment: NEGATIVE
Comment: NEGATIVE
Comment: NORMAL
Neisseria Gonorrhea: NEGATIVE
Trichomonas: NEGATIVE

## 2022-12-04 LAB — RPR: RPR Ser Ql: NONREACTIVE

## 2022-12-04 LAB — HEPATITIS B SURFACE ANTIGEN: Hepatitis B Surface Ag: NEGATIVE

## 2022-12-04 LAB — HEPATITIS C ANTIBODY: Hep C Virus Ab: NONREACTIVE

## 2023-01-15 ENCOUNTER — Ambulatory Visit (INDEPENDENT_AMBULATORY_CARE_PROVIDER_SITE_OTHER): Payer: Medicaid Other

## 2023-01-15 ENCOUNTER — Other Ambulatory Visit (HOSPITAL_COMMUNITY)
Admission: RE | Admit: 2023-01-15 | Discharge: 2023-01-15 | Disposition: A | Discharge status: Home / Self Care | Payer: Medicaid Other | Source: Ambulatory Visit | Attending: Obstetrics & Gynecology | Admitting: Obstetrics & Gynecology

## 2023-01-15 VITALS — BP 108/73 | HR 79 | Ht 63.0 in | Wt 223.0 lb

## 2023-01-15 DIAGNOSIS — R300 Dysuria: Secondary | ICD-10-CM | POA: Diagnosis not present

## 2023-01-15 DIAGNOSIS — N898 Other specified noninflammatory disorders of vagina: Secondary | ICD-10-CM

## 2023-01-15 LAB — POCT URINALYSIS DIPSTICK
Bilirubin, UA: NEGATIVE
Blood, UA: NEGATIVE
Glucose, UA: NEGATIVE
Ketones, UA: NEGATIVE
Nitrite, UA: NEGATIVE
Protein, UA: NEGATIVE
Spec Grav, UA: 1.02 (ref 1.010–1.025)
Urobilinogen, UA: 0.2 E.U./dL
pH, UA: 5 (ref 5.0–8.0)

## 2023-01-15 LAB — POCT URINE PREGNANCY: Preg Test, Ur: NEGATIVE

## 2023-01-15 NOTE — Progress Notes (Addendum)
SUBJECTIVE:  22 y.o. female complains of creamy vaginal discharge, vaginal odor and urinary frequency for 6 day(s). Denies abnormal vaginal bleeding or significant pelvic pain or fever.  Denies history of known exposure to STD.  Pt requested UPT.  Patient's last menstrual period was 12/26/2022 (exact date).  OBJECTIVE:  She appears well, afebrile. Urine dipstick: positive for leukocytes.  ASSESSMENT:  Vaginal Discharge  Vaginal Odor Urinary frequency and discomfort UPT Negative   PLAN:  GC, chlamydia, trichomonas, BVAG, CVAG probe and Urine culture sent to lab. Treatment: To be determined once lab results are received ROV prn if symptoms persist or worsen.

## 2023-01-16 ENCOUNTER — Other Ambulatory Visit: Payer: Self-pay

## 2023-01-16 DIAGNOSIS — A599 Trichomoniasis, unspecified: Secondary | ICD-10-CM

## 2023-01-16 LAB — CERVICOVAGINAL ANCILLARY ONLY
Bacterial Vaginitis (gardnerella): NEGATIVE
Candida Glabrata: NEGATIVE
Candida Vaginitis: NEGATIVE
Chlamydia: NEGATIVE
Comment: NEGATIVE
Comment: NEGATIVE
Comment: NEGATIVE
Comment: NEGATIVE
Comment: NEGATIVE
Comment: NORMAL
Neisseria Gonorrhea: NEGATIVE
Trichomonas: POSITIVE — AB

## 2023-01-16 MED ORDER — METRONIDAZOLE 500 MG PO TABS: 500.0000 mg | ORAL_TABLET | Freq: Two times a day (BID) | ORAL | 0 refills | Status: DC

## 2023-01-19 LAB — URINE CULTURE

## 2023-01-27 ENCOUNTER — Other Ambulatory Visit: Payer: Self-pay | Admitting: Emergency Medicine

## 2023-01-27 DIAGNOSIS — A599 Trichomoniasis, unspecified: Secondary | ICD-10-CM

## 2023-01-27 MED ORDER — METRONIDAZOLE 500 MG PO TABS: 500.0000 mg | ORAL_TABLET | Freq: Two times a day (BID) | ORAL | 0 refills | Status: DC

## 2023-01-27 NOTE — Progress Notes (Signed)
Attempted RC to patient who requests Flagyl to be reordered, as she vomited medication.  No answer, unable to leave voicemail.  Rx sent to pharmacy on file.

## 2023-02-01 ENCOUNTER — Encounter: Payer: Self-pay | Admitting: Obstetrics & Gynecology

## 2023-02-01 ENCOUNTER — Other Ambulatory Visit: Payer: Self-pay | Admitting: Obstetrics & Gynecology

## 2023-02-01 DIAGNOSIS — R300 Dysuria: Secondary | ICD-10-CM

## 2023-02-01 MED ORDER — SULFAMETHOXAZOLE-TRIMETHOPRIM 800-160 MG PO TABS: 1.0000 | ORAL_TABLET | Freq: Two times a day (BID) | ORAL | 0 refills | Status: DC

## 2023-02-03 ENCOUNTER — Encounter: Payer: Self-pay | Admitting: Obstetrics & Gynecology

## 2023-02-04 ENCOUNTER — Other Ambulatory Visit: Payer: Self-pay

## 2023-02-04 DIAGNOSIS — N390 Urinary tract infection, site not specified: Secondary | ICD-10-CM

## 2023-02-04 MED ORDER — AMOXICILLIN 500 MG PO CAPS: 500.0000 mg | ORAL_CAPSULE | Freq: Three times a day (TID) | ORAL | 2 refills | Status: DC

## 2023-02-12 ENCOUNTER — Ambulatory Visit (INDEPENDENT_AMBULATORY_CARE_PROVIDER_SITE_OTHER): Payer: Medicaid Other

## 2023-02-12 ENCOUNTER — Ambulatory Visit: Payer: Medicaid Other

## 2023-02-12 ENCOUNTER — Other Ambulatory Visit (HOSPITAL_COMMUNITY)
Admission: RE | Admit: 2023-02-12 | Discharge: 2023-02-12 | Disposition: A | Discharge status: Home / Self Care | Payer: Medicaid Other | Source: Ambulatory Visit | Attending: Obstetrics & Gynecology | Admitting: Obstetrics & Gynecology

## 2023-02-12 VITALS — BP 111/74 | HR 87 | Ht 63.0 in | Wt 226.0 lb

## 2023-02-12 DIAGNOSIS — N898 Other specified noninflammatory disorders of vagina: Secondary | ICD-10-CM

## 2023-02-12 DIAGNOSIS — Z113 Encounter for screening for infections with a predominantly sexual mode of transmission: Secondary | ICD-10-CM | POA: Insufficient documentation

## 2023-02-12 DIAGNOSIS — A599 Trichomoniasis, unspecified: Secondary | ICD-10-CM | POA: Diagnosis present

## 2023-02-12 DIAGNOSIS — N760 Acute vaginitis: Secondary | ICD-10-CM

## 2023-02-12 DIAGNOSIS — Z3201 Encounter for pregnancy test, result positive: Secondary | ICD-10-CM | POA: Diagnosis not present

## 2023-02-12 DIAGNOSIS — N912 Amenorrhea, unspecified: Secondary | ICD-10-CM | POA: Diagnosis not present

## 2023-02-12 DIAGNOSIS — N926 Irregular menstruation, unspecified: Secondary | ICD-10-CM

## 2023-02-12 LAB — POCT URINE PREGNANCY: Preg Test, Ur: POSITIVE — AB

## 2023-02-12 NOTE — Progress Notes (Signed)
SUBJECTIVE:  22 y.o. female who desires a STI screen/TOC and a UPT. Pt has a positive upt at home. LMP is 12/26/2022. Complains of vaginal discharge, but denies bleeding or significant pelvic pain. No UTI symptoms. History of known exposure to Trichomonas.  Patient's last menstrual period was 12/26/2022 (exact date).  OBJECTIVE:  She appears well.   ASSESSMENT:  STI Screen  UPT in office: Positive   PLAN:  Pt offered STI blood screening-declined GC, chlamydia, and trichomonas probe sent to lab.  Treatment: To be determined once lab results are received.  Pt follow up as needed. Reviewed medications and informed patient to discontinue ibuprofen and start a prenatal vitamin. This is an unwanted pregnancy and the patient does not wish to schedule a new ob intake at this time.

## 2023-02-13 LAB — CERVICOVAGINAL ANCILLARY ONLY
Bacterial Vaginitis (gardnerella): NEGATIVE
Candida Glabrata: NEGATIVE
Candida Vaginitis: POSITIVE — AB
Chlamydia: NEGATIVE
Comment: NEGATIVE
Comment: NEGATIVE
Comment: NEGATIVE
Comment: NEGATIVE
Comment: NEGATIVE
Comment: NORMAL
Neisseria Gonorrhea: NEGATIVE
Trichomonas: NEGATIVE

## 2023-02-13 MED ORDER — TERCONAZOLE 0.4 % VA CREA: 1.0000 | TOPICAL_CREAM | Freq: Every day | VAGINAL | 0 refills | Status: DC

## 2023-02-13 NOTE — Addendum Note (Signed)
Addended by: Jaynie Collins A on: 02/13/2023 12:29 PM   Modules accepted: Orders

## 2023-02-17 ENCOUNTER — Encounter: Payer: Self-pay | Admitting: Emergency Medicine

## 2023-02-17 ENCOUNTER — Other Ambulatory Visit: Payer: Self-pay | Admitting: Obstetrics & Gynecology

## 2023-02-17 DIAGNOSIS — N760 Acute vaginitis: Secondary | ICD-10-CM

## 2023-03-14 ENCOUNTER — Encounter: Payer: Self-pay | Admitting: Obstetrics

## 2023-03-17 ENCOUNTER — Ambulatory Visit: Payer: Medicaid Other

## 2023-03-24 ENCOUNTER — Ambulatory Visit: Payer: Medicaid Other

## 2023-03-24 ENCOUNTER — Other Ambulatory Visit (HOSPITAL_COMMUNITY)
Admission: RE | Admit: 2023-03-24 | Discharge: 2023-03-24 | Disposition: A | Discharge status: Home / Self Care | Payer: Medicaid Other | Source: Ambulatory Visit | Attending: Advanced Practice Midwife | Admitting: Advanced Practice Midwife

## 2023-03-24 ENCOUNTER — Ambulatory Visit (INDEPENDENT_AMBULATORY_CARE_PROVIDER_SITE_OTHER): Payer: Medicaid Other | Admitting: Obstetrics and Gynecology

## 2023-03-24 ENCOUNTER — Encounter: Payer: Self-pay | Admitting: Advanced Practice Midwife

## 2023-03-24 VITALS — BP 103/73 | HR 69 | Ht 62.0 in | Wt 230.4 lb

## 2023-03-24 DIAGNOSIS — Z113 Encounter for screening for infections with a predominantly sexual mode of transmission: Secondary | ICD-10-CM

## 2023-03-24 DIAGNOSIS — Z3045 Encounter for surveillance of transdermal patch hormonal contraceptive device: Secondary | ICD-10-CM | POA: Diagnosis not present

## 2023-03-24 DIAGNOSIS — N898 Other specified noninflammatory disorders of vagina: Secondary | ICD-10-CM

## 2023-03-24 DIAGNOSIS — Z9889 Other specified postprocedural states: Secondary | ICD-10-CM

## 2023-03-24 MED ORDER — NORELGESTROMIN-ETH ESTRADIOL 150-35 MCG/24HR TD PTWK
1.0000 | MEDICATED_PATCH | TRANSDERMAL | 3 refills | Status: DC
Start: 2023-03-24 — End: 2024-03-03

## 2023-03-24 NOTE — Progress Notes (Unsigned)
Pt presents for AUB after IAB on 7/5. Pt reports light, bright red bleeding with clots and vaginal itch. IAB at 5 weeks

## 2023-03-24 NOTE — Progress Notes (Unsigned)
   GYNECOLOGY PROGRESS NOTE  History:  22 y.o. [id number] presents to [facility] office today for problem gyn visit. She reports vaginal itching, bleeding after abortion 7/5.  Medical abortion 7/5 at ~[redacted] weeks gestation. Had bleeding and cramping for a day but then resolved. Started using birth control patch but thought she was supposed to use one patch for 3 weeks. Had some spotting and bleeding since. Notes vaginal itching and mild creamy discharge. Hx of many yeast/BV infections prior. Has had intercourse in this time frame. Multiple partners, some female/some female.  The following portions of the patient's history were reviewed and updated as appropriate: allergies, current medications, past family history, past medical history, past social history, past surgical history and problem list.  Health Maintenance Due  Topic Date Due   COVID-19 Vaccine (1 - 2023-24 season) Never done   INFLUENZA VACCINE  03/20/2023     Review of Systems:  Pertinent items are noted in HPI.   Objective:  Physical Exam Blood pressure 103/73, pulse 69, height 5\' 2"  (1.575 m), weight 230 lb 6.4 oz (104.5 kg), not currently breastfeeding. VS reviewed, nursing note reviewed,  Constitutional: well developed, well nourished, no distress HEENT: normocephalic CV: normal rate Pulm/chest wall: normal effort Breast Exam: deferred Abdomen: soft Neuro: alert and oriented x 3 Skin: warm, dry Psych: affect normal Pelvic exam: Deferred.  Assessment & Plan:  1. Vagina itching Suspect vaginitis given recent hormonal changes. POC UA with leuks. - Send urine for culture. - Swab for BV/yeast/GC/C/trich. Counseled on cotton underwear, probiotics, unscented soaps, etc to prevent BV/yeast infections.  - Cervicovaginal ancillary only( [place]) - POC Urinalysis Dipstick OB - Urine Culture  2. Encounter for surveillance of transdermal patch hormonal contraceptive device Desires to continue patch for contraception.  Educated to switch weekly and prescription sent for continuous use. - norelgestromin-ethinyl estradiol Burr Medico) 150-35 MCG/24HR transdermal patch; Place 1 patch onto the skin once a week.  Dispense: 12 patch; Refill: 3  3. Screening examination for STI - Cervicovaginal ancillary only( [place]) - HIV antibody (with reflex) - RPR   4. History of elective abortion 7/5 medication-induced. Discussed checking hCG to ensure termination was successful. If elevated, will trend weekly and consider ultrasound. - Beta hCG quant (ref lab)  Return if symptoms worsen or fail to improve.   Joanne Gavel, MD 4:33 PM

## 2023-03-26 ENCOUNTER — Other Ambulatory Visit: Payer: Self-pay | Admitting: Obstetrics and Gynecology

## 2023-03-26 MED ORDER — METRONIDAZOLE 500 MG PO TABS: 500.0000 mg | ORAL_TABLET | Freq: Two times a day (BID) | ORAL | 0 refills | Status: AC

## 2023-03-26 MED ORDER — FLUCONAZOLE 150 MG PO TABS: 150.0000 mg | ORAL_TABLET | ORAL | 0 refills | Status: AC

## 2023-03-27 ENCOUNTER — Other Ambulatory Visit: Payer: Self-pay | Admitting: Obstetrics and Gynecology

## 2023-03-27 MED ORDER — NITROFURANTOIN MONOHYD MACRO 100 MG PO CAPS: 100.0000 mg | ORAL_CAPSULE | Freq: Two times a day (BID) | ORAL | 0 refills | Status: DC

## 2023-03-27 MED ORDER — NITROFURANTOIN MONOHYD MACRO 100 MG PO CAPS: 100.0000 mg | ORAL_CAPSULE | Freq: Two times a day (BID) | ORAL | 0 refills | Status: AC

## 2023-03-27 NOTE — Progress Notes (Signed)
Tx UTI.

## 2023-04-01 ENCOUNTER — Other Ambulatory Visit: Payer: Self-pay | Admitting: Internal Medicine

## 2023-04-08 ENCOUNTER — Encounter: Payer: Self-pay | Admitting: Obstetrics

## 2023-04-09 ENCOUNTER — Other Ambulatory Visit (HOSPITAL_COMMUNITY)
Admission: RE | Admit: 2023-04-09 | Discharge: 2023-04-09 | Disposition: A | Discharge status: Home / Self Care | Payer: Medicaid Other | Source: Ambulatory Visit | Attending: Obstetrics and Gynecology | Admitting: Obstetrics and Gynecology

## 2023-04-09 ENCOUNTER — Ambulatory Visit: Payer: Medicaid Other | Admitting: Emergency Medicine

## 2023-04-09 DIAGNOSIS — N898 Other specified noninflammatory disorders of vagina: Secondary | ICD-10-CM | POA: Diagnosis present

## 2023-04-09 DIAGNOSIS — Z3202 Encounter for pregnancy test, result negative: Secondary | ICD-10-CM | POA: Diagnosis not present

## 2023-04-09 DIAGNOSIS — R300 Dysuria: Secondary | ICD-10-CM

## 2023-04-09 DIAGNOSIS — N939 Abnormal uterine and vaginal bleeding, unspecified: Secondary | ICD-10-CM

## 2023-04-11 LAB — CERVICOVAGINAL ANCILLARY ONLY
Bacterial Vaginitis (gardnerella): NEGATIVE
Candida Glabrata: NEGATIVE
Candida Vaginitis: NEGATIVE
Chlamydia: NEGATIVE
Comment: NEGATIVE
Comment: NEGATIVE
Comment: NEGATIVE
Comment: NEGATIVE
Comment: NEGATIVE
Comment: NORMAL
Neisseria Gonorrhea: NEGATIVE
Trichomonas: NEGATIVE

## 2023-04-11 LAB — URINE CULTURE: Organism ID, Bacteria: NO GROWTH

## 2023-04-14 LAB — POCT URINE PREGNANCY: Preg Test, Ur: NEGATIVE

## 2023-04-14 NOTE — Progress Notes (Signed)
Pt presents with c/o recurrent dysuria and vaginal irritation.  Pt has significant anxiety in office and concerned about infection.

## 2023-06-21 ENCOUNTER — Encounter (HOSPITAL_COMMUNITY): Payer: Self-pay | Admitting: Obstetrics and Gynecology

## 2023-06-21 ENCOUNTER — Inpatient Hospital Stay (HOSPITAL_COMMUNITY)
Admission: AD | Admit: 2023-06-21 | Discharge: 2023-06-22 | Disposition: A | Discharge status: Home / Self Care | Payer: Medicaid Other | Attending: Obstetrics and Gynecology | Admitting: Obstetrics and Gynecology

## 2023-06-21 DIAGNOSIS — Z789 Other specified health status: Secondary | ICD-10-CM

## 2023-06-21 DIAGNOSIS — Z3202 Encounter for pregnancy test, result negative: Secondary | ICD-10-CM | POA: Diagnosis present

## 2023-06-21 LAB — POCT PREGNANCY, URINE: Preg Test, Ur: NEGATIVE

## 2023-06-21 LAB — HCG, QUANTITATIVE, PREGNANCY: hCG, Beta Chain, Quant, S: <1 m[IU]/mL (ref <5)

## 2023-06-21 NOTE — MAU Provider Note (Signed)
Event Date/Time   First Provider Initiated Contact with Patient 06/21/23 2157      S Ms. Briana Curry is a 22 y.o. [id number] patient who presents to MAU today with complaint of ***.   O BP 119/74 (BP Location: Right Arm)   Pulse 76   Temp 98.7 F (37.1 C) (Oral)   Resp 17   Ht 5\' 3"  (1.6 m)   Wt 107 kg   SpO2 99%   BMI 41.81 kg/m   Physical Exam  A Medical screening exam {Blank single:19197::"complete","started"} @DX @  P Discharge from MAU in stable condition Patient given the option of transfer to [facility] for further evaluation or seek care in outpatient facility of choice *** List of options for follow-up given *** Warning signs for worsening condition that would warrant emergency follow-up discussed Patient may return to MAU as needed   Raelyn Mora, CNM 06/21/2023 10:02 PM

## 2023-06-21 NOTE — MAU Note (Signed)
.  Briana Curry is a 22 y.o. at Unknown here in MAU reporting: positive home preg test on 10/31 and today she started having some spotting. Also reports pain in the bottom of her stomach that feels like contractions  LMP: 05/24/2023 Onset of complaint: today Pain score: 9/10 There were no vitals filed for this visit.   FHT:n/a Lab orders placed from triage:

## 2023-06-22 DIAGNOSIS — Z789 Other specified health status: Secondary | ICD-10-CM | POA: Diagnosis not present

## 2023-06-22 NOTE — Progress Notes (Signed)
Pt left prior to provider discussing results.

## 2023-07-08 ENCOUNTER — Ambulatory Visit (INDEPENDENT_AMBULATORY_CARE_PROVIDER_SITE_OTHER): Payer: Medicaid Other

## 2023-07-08 ENCOUNTER — Ambulatory Visit (HOSPITAL_COMMUNITY)
Admission: EM | Admit: 2023-07-08 | Discharge: 2023-07-08 | Disposition: A | Discharge status: Home / Self Care | Payer: Medicaid Other | Attending: Family Medicine | Admitting: Family Medicine

## 2023-07-08 ENCOUNTER — Encounter (HOSPITAL_COMMUNITY): Payer: Self-pay

## 2023-07-08 DIAGNOSIS — J069 Acute upper respiratory infection, unspecified: Secondary | ICD-10-CM | POA: Insufficient documentation

## 2023-07-08 DIAGNOSIS — M545 Low back pain, unspecified: Secondary | ICD-10-CM | POA: Diagnosis not present

## 2023-07-08 DIAGNOSIS — R051 Acute cough: Secondary | ICD-10-CM | POA: Diagnosis not present

## 2023-07-08 LAB — POCT URINALYSIS DIP (MANUAL ENTRY)
Bilirubin, UA: NEGATIVE
Blood, UA: NEGATIVE
Glucose, UA: NEGATIVE mg/dL
Leukocytes, UA: NEGATIVE
Nitrite, UA: NEGATIVE
Protein Ur, POC: 30 mg/dL — AB
Spec Grav, UA: >=1.03 — AB (ref 1.010–1.025)
Urobilinogen, UA: 1 U/dL
pH, UA: 6.5 (ref 5.0–8.0)

## 2023-07-08 LAB — POCT INFLUENZA A/B
Influenza A, POC: NEGATIVE
Influenza B, POC: NEGATIVE

## 2023-07-08 MED ORDER — IBUPROFEN 800 MG PO TABS: 800.0000 mg | ORAL_TABLET | Freq: Three times a day (TID) | ORAL | 0 refills | Status: AC | PRN

## 2023-07-08 MED ORDER — BENZONATATE 100 MG PO CAPS: 100.0000 mg | ORAL_CAPSULE | Freq: Three times a day (TID) | ORAL | 0 refills | Status: DC | PRN

## 2023-07-08 NOTE — ED Triage Notes (Signed)
Pt presents with complaints of lower back pain, dizziness, hot flashes, and a non-productive cough x 1 day. Pt reports taking an 800 mg Ibuprofen this AM @ 0500 with improvement in pain however it has now returned. Pt currently rates her pain as a 7/10. Pt is unsure of fevers at home.

## 2023-07-08 NOTE — Discharge Instructions (Addendum)
Your flu test was negative  Your chest x-ray does not show pneumonia primary. The radiologist will also read your x-ray, and if their interpretation differs significantly from mine, we will call you.  The urinalysis does not show any sign of infection.  There are no white blood cells or red blood cells or nitrites.   Take benzonatate 100 mg, 1 tab every 8 hours as needed for cough.  Take ibuprofen 800 mg--1 tab every 8 hours as needed for pain.   You have been swabbed for COVID, and the test will result in the next 24 hours. Our staff will call you if positive. If the COVID test is positive, you should quarantine until you are fever free for 24 hours and you are starting to feel better, and then take added precautions for the next 5 days, such as physical distancing/wearing a mask and good hand hygiene/washing.  Make sure you are drinking enough fluids

## 2023-07-08 NOTE — ED Provider Notes (Signed)
[facility]    CSN: [id number] Arrival date & time: 07/08/23  0865      History   Chief Complaint Chief Complaint  Patient presents with   Back Pain   Cough   Dizziness    HPI [facility] is a 22 y.o. female.    Back Pain Cough Dizziness Low back pain, cough and nasal congestion.  She is also felt dizzy and had "hot flashes".  No vomiting or diarrhea.  No shortness of breath.  No dysuria.  She states "but my urine smells".  Last menstrual cycle was November 2, after she had just been to the [facility] and tested negative for pregnancy there.  Possible history of asthma   Past Medical History:  Diagnosis Date   Anemia    Asthma    Broken arm    Chlamydia infection affecting pregnancy 11/21/2021   Tx 11/20/2021     Encounter for supervision of normal pregnancy, antepartum 05/29/2021              Nursing Staff    Provider      Office Location     [facility]    Dating     LMP      Language     English    Anatomy US            Flu Vaccine     Declined 05/29/21    Genetic/Carrier Screen     NIPS:   negative  AFP:  to late  Horizon: negative      TDaP Vaccine      Declined 10/17/21    Hgb A1C or   GTT    Early   Third trimester       COVID Vaccine    Completed         LAB RESULTS        Iron deficiency    Multiple allergies    Post-dates pregnancy 01/08/2022    Patient Active Problem List   Diagnosis Date Noted   History of elective abortion 03/24/2023   History of COVID-19 09/03/2021   GBS bacteriuria 07/22/2021   Obesity (BMI 30-39.9) 09/09/2019   Other allergic rhinitis 09/21/2018   Lactose intolerance 11/03/2017   BMI 39.0-39.9,adult 07/25/2016    Past Surgical History:  Procedure Laterality Date   ARM HARDWARE REMOVAL      OB History     Gravida  2   Para  2   Term  2   Preterm      AB      Living  2      SAB      IAB      Ectopic      Multiple  0   Live Births  2            Home Medications    Prior to Admission  medications   Medication Sig Start Date End Date Taking? Authorizing Provider  benzonatate (TESSALON) 100 MG capsule Take 1 capsule (100 mg total) by mouth 3 (three) times daily as needed for cough. 07/08/23  Yes Zenia Resides, MD  ibuprofen (ADVIL) 800 MG tablet Take 1 tablet (800 mg total) by mouth every 8 (eight) hours as needed (pain). 07/08/23  Yes Zenia Resides, MD  norelgestromin-ethinyl estradiol Burr Medico) 150-35 MCG/24HR transdermal patch Place 1 patch onto the skin once a week. 03/24/23   Joanne Gavel, MD    Family History Family History  Problem Relation Age  of Onset   Asthma Mother    Asthma Sister    Eczema Sister    Urticaria Sister    Asthma Brother    Eczema Brother     Social History Social History   Tobacco Use   Smoking status: Former    Types: Cigars    Quit date: 03/2021    Years since quitting: 2.3    Passive exposure: Never   Smokeless tobacco: Never  Vaping Use   Vaping status: Never Used  Substance Use Topics   Alcohol use: Yes    Comment: social   Drug use: No     Allergies   Lactose intolerance (gi) and Other   Review of Systems Review of Systems  Respiratory:  Positive for cough.   Musculoskeletal:  Positive for back pain.  Neurological:  Positive for dizziness.     Physical Exam Triage Vital Signs ED Triage Vitals  Encounter Vitals Group     BP 07/08/23 1033 107/71     Systolic BP Percentile --      Diastolic BP Percentile --      Pulse Rate 07/08/23 1033 92     Resp 07/08/23 1033 16     Temp 07/08/23 1033 98.3 F (36.8 C)     Temp Source 07/08/23 1033 Oral     SpO2 07/08/23 1033 98 %     Weight --      Height --      Head Circumference --      Peak Flow --      Pain Score 07/08/23 1037 7     Pain Loc --      Pain Education --      Exclude from Growth Chart --    No data found.  Updated Vital Signs BP 107/71 (BP Location: Left Arm)   Pulse 92   Temp 98.3 F (36.8 C) (Oral)   Resp 16   LMP  06/21/2023 (Exact Date)   SpO2 98%   Breastfeeding No   Visual Acuity Right Eye Distance:   Left Eye Distance:   Bilateral Distance:    Right Eye Near:   Left Eye Near:    Bilateral Near:     Physical Exam Vitals reviewed.  Constitutional:      General: She is not in acute distress.    Appearance: She is not ill-appearing, toxic-appearing or diaphoretic.  HENT:     Right Ear: Tympanic membrane and ear canal normal.     Left Ear: Tympanic membrane and ear canal normal.     Nose: Congestion present.     Mouth/Throat:     Mouth: Mucous membranes are moist.     Comments: No erythema.  There is no evidence draining Eyes:     Extraocular Movements: Extraocular movements intact.     Conjunctiva/sclera: Conjunctivae normal.     Pupils: Pupils are equal, round, and reactive to light.  Cardiovascular:     Rate and Rhythm: Normal rate and regular rhythm.     Heart sounds: No murmur heard. Pulmonary:     Effort: Pulmonary effort is normal. No respiratory distress.     Breath sounds: No stridor. No wheezing, rhonchi or rales.  Abdominal:     Palpations: Abdomen is soft.     Tenderness: There is no abdominal tenderness. There is no right CVA tenderness or left CVA tenderness.  Musculoskeletal:     Cervical back: Neck supple.     Comments: There is tenderness in the lower lumbar  midline.  Lymphadenopathy:     Cervical: No cervical adenopathy.  Skin:    Capillary Refill: Capillary refill takes less than 2 seconds.     Coloration: Skin is not jaundiced or pale.  Neurological:     General: No focal deficit present.     Mental Status: She is alert and oriented to person, place, and time.  Psychiatric:        Behavior: Behavior normal.      UC Treatments / Results  Labs (all labs ordered are listed, but only abnormal results are displayed) Labs Reviewed  POCT URINALYSIS DIP (MANUAL ENTRY) - Abnormal; Notable for the following components:      Result Value   Color, UA orange  (*)    Ketones, POC UA trace (5) (*)    Spec Grav, UA >=1.030 (*)    Protein Ur, POC =30 (*)    All other components within normal limits  SARS CORONAVIRUS 2 (TAT 6-24 HRS)  POCT INFLUENZA A/B    EKG   Radiology No results found.  Procedures Procedures (including critical care time)  Medications Ordered in UC Medications - No data to display  Initial Impression / Assessment and Plan / UC Course  I have reviewed the triage vital signs and the nursing notes.  Pertinent labs & imaging results that were available during my care of the patient were reviewed by me and considered in my medical decision making (see chart for details).     Point-of-care flu test is negative.  Urinalysis is negative for leukocytes, red blood cells, or nitrites.  COVID swab is done and we will notify her if positive, so she knows if she needs to isolate.  Chest x-ray is normal without any infiltrate by my review.  She is advised of radiology over read  Prescription strength ibuprofen is sent in for pain and Tessalon Perles were sent in for cough. Final Clinical Impressions(s) / UC Diagnoses   Final diagnoses:  Viral upper respiratory tract infection  Acute cough  Acute midline low back pain without sciatica     Discharge Instructions      Your flu test was negative  Your chest x-ray does not show pneumonia primary. The radiologist will also read your x-ray, and if their interpretation differs significantly from mine, we will call you.  The urinalysis does not show any sign of infection.  There are no white blood cells or red blood cells or nitrites.   Take benzonatate 100 mg, 1 tab every 8 hours as needed for cough.  Take ibuprofen 800 mg--1 tab every 8 hours as needed for pain.   You have been swabbed for COVID, and the test will result in the next 24 hours. Our staff will call you if positive. If the COVID test is positive, you should quarantine until you are fever free for 24  hours and you are starting to feel better, and then take added precautions for the next 5 days, such as physical distancing/wearing a mask and good hand hygiene/washing.  Make sure you are drinking enough fluids     ED Prescriptions     Medication Sig Dispense Auth. Provider   benzonatate (TESSALON) 100 MG capsule Take 1 capsule (100 mg total) by mouth 3 (three) times daily as needed for cough. 21 capsule Zenia Resides, MD   ibuprofen (ADVIL) 800 MG tablet Take 1 tablet (800 mg total) by mouth every 8 (eight) hours as needed (pain). 21 tablet Marlinda Mike, Janace Aris, MD  PDMP not reviewed this encounter.   Zenia Resides, MD 07/08/23 [phone]

## 2023-07-09 LAB — SARS CORONAVIRUS 2 (TAT 6-24 HRS): SARS Coronavirus 2: NEGATIVE

## 2023-07-31 ENCOUNTER — Ambulatory Visit: Payer: Medicaid Other

## 2023-08-05 ENCOUNTER — Other Ambulatory Visit (HOSPITAL_COMMUNITY)
Admission: RE | Admit: 2023-08-05 | Discharge: 2023-08-05 | Disposition: A | Discharge status: Home / Self Care | Payer: Medicaid Other | Source: Ambulatory Visit | Attending: Obstetrics and Gynecology | Admitting: Obstetrics and Gynecology

## 2023-08-05 ENCOUNTER — Ambulatory Visit (INDEPENDENT_AMBULATORY_CARE_PROVIDER_SITE_OTHER): Payer: Medicaid Other | Admitting: Emergency Medicine

## 2023-08-05 VITALS — BP 103/62 | HR 84 | Wt 234.0 lb

## 2023-08-05 DIAGNOSIS — N898 Other specified noninflammatory disorders of vagina: Secondary | ICD-10-CM | POA: Insufficient documentation

## 2023-08-05 NOTE — Progress Notes (Signed)
SUBJECTIVE:  22 y.o. female complains of clear vaginal discharge for 1 week(s). Denies abnormal vaginal bleeding or significant pelvic pain or fever. No UTI symptoms. Denies history of known exposure to STD.  Patient's last menstrual period was 06/21/2023 (exact date).  OBJECTIVE:  She appears well, afebrile. Urine dipstick: negative for all components.  ASSESSMENT:  Vaginal Discharge  Vaginal Odor   PLAN:  GC, chlamydia, trichomonas, BVAG, CVAG probe sent to lab. Treatment: To be determined once lab results are received ROV prn if symptoms persist or worsen.

## 2023-08-07 LAB — CERVICOVAGINAL ANCILLARY ONLY
Bacterial Vaginitis (gardnerella): POSITIVE — AB
Candida Glabrata: NEGATIVE
Candida Vaginitis: NEGATIVE
Chlamydia: NEGATIVE
Comment: NEGATIVE
Comment: NEGATIVE
Comment: NEGATIVE
Comment: NEGATIVE
Comment: NEGATIVE
Comment: NORMAL
Neisseria Gonorrhea: NEGATIVE
Trichomonas: NEGATIVE

## 2023-08-14 ENCOUNTER — Ambulatory Visit: Payer: Medicaid Other | Admitting: Obstetrics

## 2023-08-15 ENCOUNTER — Other Ambulatory Visit: Payer: Self-pay

## 2023-08-15 MED ORDER — METRONIDAZOLE 0.75 % VA GEL: 1.0000 | Freq: Every day | VAGINAL | 0 refills | Status: AC

## 2023-08-18 ENCOUNTER — Ambulatory Visit: Payer: Medicaid Other | Admitting: Obstetrics

## 2023-10-03 ENCOUNTER — Ambulatory Visit
Admission: RE | Admit: 2023-10-03 | Discharge: 2023-10-03 | Disposition: A | Discharge status: Home / Self Care | Payer: Medicaid Other | Source: Ambulatory Visit | Attending: Internal Medicine | Admitting: Internal Medicine

## 2023-10-03 VITALS — BP 111/73 | HR 90 | Temp 98.1°F | Resp 20

## 2023-10-03 DIAGNOSIS — R519 Headache, unspecified: Secondary | ICD-10-CM | POA: Diagnosis present

## 2023-10-03 DIAGNOSIS — J069 Acute upper respiratory infection, unspecified: Secondary | ICD-10-CM

## 2023-10-03 DIAGNOSIS — R059 Cough, unspecified: Secondary | ICD-10-CM | POA: Diagnosis present

## 2023-10-03 DIAGNOSIS — Z87891 Personal history of nicotine dependence: Secondary | ICD-10-CM | POA: Insufficient documentation

## 2023-10-03 DIAGNOSIS — R6883 Chills (without fever): Secondary | ICD-10-CM | POA: Diagnosis present

## 2023-10-03 DIAGNOSIS — J45909 Unspecified asthma, uncomplicated: Secondary | ICD-10-CM | POA: Diagnosis not present

## 2023-10-03 LAB — POC COVID19/FLU A&B COMBO
Covid Antigen, POC: NEGATIVE
Influenza A Antigen, POC: NEGATIVE
Influenza B Antigen, POC: NEGATIVE

## 2023-10-03 NOTE — Discharge Instructions (Signed)

## 2023-10-03 NOTE — ED Provider Notes (Signed)
[facility]    CSN: [id number] Arrival date & time: 10/03/23  1315      History   Chief Complaint Chief Complaint  Patient presents with   Fever    Flu like symptoms - Entered by patient    HPI Briana Curry is a 23 y.o. female.   Patient presents to urgent care for evaluation of cough, congestion, fever, chills, body aches, headache, and fatigue that started yesterday. Her children are sick with similar symptoms.  Cough is mostly dry and nonproductive.  History of asthma, former cigar smoker, she has not needed to use to use her albuterol inhaler during this illness.  Denies N/V/D, abdominal pain, rash, dizziness.  She is unsure of known fever at home but reports chills.  Taking over-the-counter medications with some relief.   Fever   Past Medical History:  Diagnosis Date   Anemia    Asthma    Broken arm    Chlamydia infection affecting pregnancy 11/21/2021   Tx 11/20/2021     Encounter for supervision of normal pregnancy, antepartum 05/29/2021              Nursing Staff    Provider      Office Location     [facility]    Dating     LMP      Language     English    Anatomy US            Flu Vaccine     Declined 05/29/21    Genetic/Carrier Screen     NIPS:   negative  AFP:  to late  Horizon: negative      TDaP Vaccine      Declined 10/17/21    Hgb A1C or   GTT    Early   Third trimester       COVID Vaccine    Completed         LAB RESULTS        Iron deficiency    Multiple allergies    Post-dates pregnancy 01/08/2022    Patient Active Problem List   Diagnosis Date Noted   History of elective abortion 03/24/2023   History of COVID-19 09/03/2021   GBS bacteriuria 07/22/2021   Obesity (BMI 30-39.9) 09/09/2019   Other allergic rhinitis 09/21/2018   Lactose intolerance 11/03/2017   BMI 39.0-39.9,adult 07/25/2016    Past Surgical History:  Procedure Laterality Date   ARM HARDWARE REMOVAL      OB History     Gravida  2   Para  2   Term  2   Preterm       AB      Living  2      SAB      IAB      Ectopic      Multiple  0   Live Births  2            Home Medications    Prior to Admission medications   Medication Sig Start Date End Date Taking? Authorizing Provider  benzonatate (TESSALON) 100 MG capsule Take 1 capsule (100 mg total) by mouth 3 (three) times daily as needed for cough. 07/08/23   Zenia Resides, MD  ibuprofen (ADVIL) 800 MG tablet Take 1 tablet (800 mg total) by mouth every 8 (eight) hours as needed (pain). 07/08/23   Zenia Resides, MD  norelgestromin-ethinyl estradiol Burr Medico) 150-35 MCG/24HR transdermal patch Place 1 patch onto the skin once a  week. 03/24/23   Joanne Gavel, MD    Family History Family History  Problem Relation Age of Onset   Asthma Mother    Asthma Sister    Eczema Sister    Urticaria Sister    Asthma Brother    Eczema Brother     Social History Social History   Tobacco Use   Smoking status: Former    Types: Cigars    Quit date: 03/2021    Years since quitting: 2.5    Passive exposure: Never   Smokeless tobacco: Never  Vaping Use   Vaping status: Never Used  Substance Use Topics   Alcohol use: Yes    Comment: social   Drug use: No     Allergies   Lactose intolerance (gi) and Other   Review of Systems Review of Systems  Constitutional:  Positive for fever.  Per HPI   Physical Exam Triage Vital Signs ED Triage Vitals  Encounter Vitals Group     BP 10/03/23 1347 111/73     Systolic BP Percentile --      Diastolic BP Percentile --      Pulse Rate 10/03/23 1347 90     Resp 10/03/23 1347 20     Temp 10/03/23 1347 98.1 F (36.7 C)     Temp Source 10/03/23 1347 Oral     SpO2 10/03/23 1347 98 %     Weight --      Height --      Head Circumference --      Peak Flow --      Pain Score 10/03/23 1350 9     Pain Loc --      Pain Education --      Exclude from Growth Chart --    No data found.  Updated Vital Signs BP 111/73   Pulse 90    Temp 98.1 F (36.7 C) (Oral)   Resp 20   LMP 09/20/2023 (Exact Date)   SpO2 98%   Visual Acuity Right Eye Distance:   Left Eye Distance:   Bilateral Distance:    Right Eye Near:   Left Eye Near:    Bilateral Near:     Physical Exam Vitals and nursing note reviewed.  Constitutional:      Appearance: She is not ill-appearing or toxic-appearing.  HENT:     Head: Normocephalic and atraumatic.     Right Ear: Hearing, tympanic membrane, ear canal and external ear normal.     Left Ear: Hearing, tympanic membrane, ear canal and external ear normal.     Nose: Congestion present.     Mouth/Throat:     Lips: Pink.     Mouth: Mucous membranes are moist. No injury or oral lesions.     Dentition: Normal dentition.     Tongue: No lesions.     Pharynx: Oropharynx is clear. Uvula midline. No pharyngeal swelling, oropharyngeal exudate, posterior oropharyngeal erythema, uvula swelling or postnasal drip.     Tonsils: No tonsillar exudate.  Eyes:     General: Lids are normal. Vision grossly intact. Gaze aligned appropriately.     Extraocular Movements: Extraocular movements intact.     Conjunctiva/sclera: Conjunctivae normal.  Neck:     Trachea: Trachea and phonation normal.  Cardiovascular:     Rate and Rhythm: Normal rate and regular rhythm.     Heart sounds: Normal heart sounds, S1 normal and S2 normal.  Pulmonary:     Effort: Pulmonary effort is normal. No respiratory  distress.     Breath sounds: Normal breath sounds and air entry.  Musculoskeletal:     Cervical back: Neck supple.  Lymphadenopathy:     Cervical: No cervical adenopathy.  Skin:    General: Skin is warm and dry.     Capillary Refill: Capillary refill takes less than 2 seconds.     Findings: No rash.  Neurological:     General: No focal deficit present.     Mental Status: She is alert and oriented to person, place, and time. Mental status is at baseline.     Cranial Nerves: No dysarthria or facial asymmetry.   Psychiatric:        Mood and Affect: Mood normal.        Speech: Speech normal.        Behavior: Behavior normal.        Thought Content: Thought content normal.        Judgment: Judgment normal.      UC Treatments / Results  Labs (all labs ordered are listed, but only abnormal results are displayed) Labs Reviewed  SARS CORONAVIRUS 2 (TAT 6-24 HRS)  POC COVID19/FLU A&B COMBO    EKG   Radiology No results found.  Procedures Procedures (including critical care time)  Medications Ordered in UC Medications - No data to display  Initial Impression / Assessment and Plan / UC Course  I have reviewed the triage vital signs and the nursing notes.  Pertinent labs & imaging results that were available during my care of the patient were reviewed by me and considered in my medical decision making (see chart for details).   1.  Viral URI with cough Suspect viral URI, viral syndrome.  Strep/viral testing: Point-of-care influenza and COVID testing negative, PCR COVID testing pending.  She is a candidate for antiviral if positive.  Physical exam findings reassuring, vital signs hemodynamically stable, and lungs clear, therefore deferred imaging of the chest.  Advised supportive care/prescriptions for symptomatic relief as outlined in AVS.    Counseled patient on potential for adverse effects with medications prescribed/recommended today, strict ER and return-to-clinic precautions discussed, patient verbalized understanding.    Final Clinical Impressions(s) / UC Diagnoses   Final diagnoses:  Viral URI with cough     Discharge Instructions      You have a viral illness which will improve on its own with rest, fluids, and medications to help with your symptoms.  Tylenol, guaifenesin (plain mucinex), and saline nasal sprays may help relieve symptoms.   Two teaspoons of honey in 1 cup of warm water every 4-6 hours may help with throat pains.  Humidifier in room at nighttime  may help soothe cough (clean well daily).   For chest pain, shortness of breath, inability to keep food or fluids down without vomiting, fever that does not respond to tylenol or motrin, or any other severe symptoms, please go to the ER for further evaluation. Return to urgent care as needed, otherwise follow-up with PCP.      ED Prescriptions   None    PDMP not reviewed this encounter.   Carlisle Beers, [place] 10/03/23 1420

## 2023-10-03 NOTE — ED Triage Notes (Signed)
Pt c/o cough, congestion, headache, chills for 1 day.

## 2023-10-04 LAB — SARS CORONAVIRUS 2 (TAT 6-24 HRS): SARS Coronavirus 2: NEGATIVE

## 2023-10-21 ENCOUNTER — Encounter: Payer: Self-pay | Admitting: Obstetrics

## 2023-10-21 ENCOUNTER — Ambulatory Visit (INDEPENDENT_AMBULATORY_CARE_PROVIDER_SITE_OTHER): Admitting: Obstetrics

## 2023-10-21 VITALS — BP 109/76 | HR 79 | Wt 233.6 lb

## 2023-10-21 DIAGNOSIS — Z3202 Encounter for pregnancy test, result negative: Secondary | ICD-10-CM | POA: Diagnosis not present

## 2023-10-21 DIAGNOSIS — R102 Pelvic and perineal pain unspecified side: Secondary | ICD-10-CM

## 2023-10-21 LAB — POCT URINALYSIS DIPSTICK
Blood, UA: NEGATIVE
Glucose, UA: NEGATIVE
Ketones, UA: NEGATIVE
Leukocytes, UA: NEGATIVE
Nitrite, UA: NEGATIVE
Protein, UA: NEGATIVE
Spec Grav, UA: 1.02 (ref 1.010–1.025)
Urobilinogen, UA: 0.2 U/dL
pH, UA: 6 (ref 5.0–8.0)

## 2023-10-21 LAB — POCT URINE PREGNANCY: Preg Test, Ur: NEGATIVE

## 2023-10-21 NOTE — Progress Notes (Signed)
 Patient ID: [facility], female   DOB: [birth year], 23 y.o.   MRN: [id number]  HPI Briana Curry is a 23 y.o. female.  Complains of pelvic pain.  Denies vaginal discharge.  Periods have been normal and regular.  Contraception:  Xulane Transdermal Patch. HPI  Past Medical History:  Diagnosis Date   Anemia    Asthma    Broken arm    Chlamydia infection affecting pregnancy 11/21/2021   Tx 11/20/2021     Encounter for supervision of normal pregnancy, antepartum 05/29/2021              Nursing Staff    Provider      Office Location     [facility]    Dating     LMP      Language     English    Anatomy US            Flu Vaccine     Declined 05/29/21    Genetic/Carrier Screen     NIPS:   negative  AFP:  to late  Horizon: negative      TDaP Vaccine      Declined 10/17/21    Hgb A1C or   GTT    Early   Third trimester       COVID Vaccine    Completed         LAB RESULTS        Iron deficiency    Multiple allergies    Post-dates pregnancy 01/08/2022    Past Surgical History:  Procedure Laterality Date   ARM HARDWARE REMOVAL      Family History  Problem Relation Age of Onset   Asthma Mother    Asthma Sister    Eczema Sister    Urticaria Sister    Asthma Brother    Eczema Brother     Social History Social History   Tobacco Use   Smoking status: Former    Types: Cigars    Quit date: 03/2021    Years since quitting: 2.5    Passive exposure: Never   Smokeless tobacco: Never  Vaping Use   Vaping status: Never Used  Substance Use Topics   Alcohol use: Yes    Comment: social   Drug use: No    Allergies  Allergen Reactions   Lactose Intolerance (Gi) Diarrhea   Other Rash    [employer] sweet and sour sauce caused rash and seafood    Current Outpatient Medications  Medication Sig Dispense Refill   ibuprofen (ADVIL) 800 MG tablet Take 1 tablet (800 mg total) by mouth every 8 (eight) hours as needed (pain). 21 tablet 0   norelgestromin-ethinyl estradiol Burr Medico) 150-35 MCG/24HR  transdermal patch Place 1 patch onto the skin once a week. 12 patch 3   benzonatate (TESSALON) 100 MG capsule Take 1 capsule (100 mg total) by mouth 3 (three) times daily as needed for cough. (Patient not taking: Reported on 10/21/2023) 21 capsule 0   No current facility-administered medications for this visit.    Review of Systems Review of Systems Constitutional: negative for fatigue and weight loss Respiratory: negative for cough and wheezing Cardiovascular: negative for chest pain, fatigue and palpitations Gastrointestinal: negative for abdominal pain and change in bowel habits Genitourinary: positive for pelvic pain Integument/breast: negative for nipple discharge Musculoskeletal:negative for myalgias Neurological: negative for gait problems and tremors Behavioral/Psych: negative for abusive relationship, depression Endocrine: negative for temperature intolerance      Blood pressure 109/76, pulse  79, weight 233 lb 9.6 oz (106 kg), last menstrual period 09/20/2023, not currently breastfeeding.  Physical Exam Physical Exam General:   Alert and no distress  Skin:   no rash or abnormalities  Lungs:   clear to auscultation bilaterally  Heart:   regular rate and rhythm, S1, S2 normal, no murmur, click, rub or gallop  Breasts:   normal without suspicious masses, skin or nipple changes or axillary nodes  Abdomen:  normal findings: no organomegaly, soft, non-tender and no hernia  Pelvis:  External genitalia: normal general appearance Urinary system: urethral meatus normal and bladder without fullness, nontender Vaginal: normal without tenderness, induration or masses Cervix: normal appearance Adnexa: normal bimanual exam Uterus: anteverted and non-tender, normal size    I have spent a total of 20 minutes of face-to-face time, excluding clinical staff time, reviewing notes and preparing to see patient, ordering tests and/or medications, and counseling the patient.   Data Reviewed Wet  prep and Cultures  Assessment     1. Pelvic pain (Primary) Rx: - POCT Urinalysis Dipstick:  NEGATIVE - POCT urine pregnancy:     NEGATIVE - US PELVIC COMPLETE WITH TRANSVAGINAL; Future     Plan   Follow up in 2 weeks.  MyChart.  Orders Placed This Encounter  Procedures   US PELVIC COMPLETE WITH TRANSVAGINAL    Standing Status:   Future    Expiration Date:   10/20/2024    Reason for Exam (SYMPTOM  OR DIAGNOSIS REQUIRED):   Pelvic pain    Preferred imaging location?:   [facility]   POCT Urinalysis Dipstick   POCT urine pregnancy     Brock Bad, MD, FACOG Attending Obstetrician & Gynecologist, Faculty Practice Center for [facility], [facility]. [facility] 10/21/2023

## 2023-10-21 NOTE — Progress Notes (Signed)
 Pt. Presents for pelvic pain, spotting pink a wk after period. No other symptoms

## 2023-10-22 ENCOUNTER — Other Ambulatory Visit (HOSPITAL_COMMUNITY)
Admission: RE | Admit: 2023-10-22 | Discharge: 2023-10-22 | Disposition: A | Discharge status: Home / Self Care | Source: Ambulatory Visit | Attending: Obstetrics | Admitting: Obstetrics

## 2023-10-22 DIAGNOSIS — R102 Pelvic and perineal pain: Secondary | ICD-10-CM | POA: Insufficient documentation

## 2023-10-22 NOTE — Addendum Note (Signed)
 Addended by: Salome Holmes on: 10/22/2023 11:17 AM   Modules accepted: Orders

## 2023-10-23 LAB — CERVICOVAGINAL ANCILLARY ONLY
Bacterial Vaginitis (gardnerella): NEGATIVE
Candida Glabrata: NEGATIVE
Candida Vaginitis: NEGATIVE
Chlamydia: NEGATIVE
Comment: NEGATIVE
Comment: NEGATIVE
Comment: NEGATIVE
Comment: NEGATIVE
Comment: NEGATIVE
Comment: NORMAL
Neisseria Gonorrhea: NEGATIVE
Trichomonas: NEGATIVE

## 2023-10-24 ENCOUNTER — Ambulatory Visit (HOSPITAL_COMMUNITY)
Admission: RE | Admit: 2023-10-24 | Discharge: 2023-10-24 | Disposition: A | Discharge status: Home / Self Care | Source: Ambulatory Visit | Attending: Obstetrics | Admitting: Obstetrics

## 2023-10-24 DIAGNOSIS — R102 Pelvic and perineal pain unspecified side: Secondary | ICD-10-CM

## 2023-10-26 ENCOUNTER — Encounter: Payer: Self-pay | Admitting: Obstetrics

## 2023-11-07 ENCOUNTER — Telehealth (INDEPENDENT_AMBULATORY_CARE_PROVIDER_SITE_OTHER): Admitting: Obstetrics

## 2023-11-07 VITALS — Ht 65.0 in | Wt 230.0 lb

## 2023-11-07 DIAGNOSIS — R102 Pelvic and perineal pain: Secondary | ICD-10-CM | POA: Diagnosis not present

## 2023-11-07 DIAGNOSIS — E669 Obesity, unspecified: Secondary | ICD-10-CM

## 2023-11-07 NOTE — Progress Notes (Signed)
 GYNECOLOGY VIRTUAL VISIT ENCOUNTER NOTE  Provider location: Center for [facility]   Patient location: Home  I connected with Briana Curry on 11/07/23 at 10:35 AM EDT by MyChart Video Encounter and verified that I am speaking with the correct person using two identifiers.   I discussed the limitations, risks, security and privacy concerns of performing an evaluation and management service virtually and the availability of in person appointments. I also discussed with the patient that there may be a patient responsible charge related to this service. The patient expressed understanding and agreed to proceed.   History:  Briana Curry is a 23 y.o. [phone] female being evaluated today for pelvic pain. She denies any abnormal vaginal discharge, bleeding, pelvic pain or other concerns.       Past Medical History:  Diagnosis Date   Anemia    Asthma    Broken arm    Chlamydia infection affecting pregnancy 11/21/2021   Tx 11/20/2021     Encounter for supervision of normal pregnancy, antepartum 05/29/2021              Nursing Staff    Provider      Office Location     [facility]    Dating     LMP      Language     English    Anatomy US            Flu Vaccine     Declined 05/29/21    Genetic/Carrier Screen     NIPS:   negative  AFP:  to late  Horizon: negative      TDaP Vaccine      Declined 10/17/21    Hgb A1C or   GTT    Early   Third trimester       COVID Vaccine    Completed         LAB RESULTS        Iron deficiency    Multiple allergies    Post-dates pregnancy 01/08/2022   Past Surgical History:  Procedure Laterality Date   ARM HARDWARE REMOVAL     The following portions of the patient's history were reviewed and updated as appropriate: allergies, current medications, past family history, past medical history, past social history, past surgical history and problem list.   Health Maintenance:  Normal pap and negative HRHPV on 09/23/2022.    Review of Systems:   Pertinent items noted in HPI and remainder of comprehensive ROS otherwise negative.  Physical Exam:   General:  Alert, oriented and cooperative. Patient appears to be in no acute distress.  Mental Status: Normal mood and affect. Normal behavior. Normal judgment and thought content.   Respiratory: Normal respiratory effort, no problems with respiration noted  Rest of physical exam deferred due to type of encounter  Labs and Imaging  10/22/23 11:16  Chlamydia Negative  Neisseria Gonorrhea Negative  Trichomonas Negative  Candida Vaginitis Negative  Candida Glabrata Negative  Bacterial Vaginitis (gardnerella) Negative      Latest Reference Range & Units 10/21/23 16:16  Bilirubin, UA  small  Glucose Negative  Negative  Ketones, UA  negative  Leukocytes,UA Negative  Negative  Nitrite, UA  negative  pH, UA 5.0 - 8.0  6.0  Protein,UA Negative  Negative  Specific Gravity, UA 1.010 - 1.025  1.020  Urobilinogen, UA 0.2 or 1.0 E.U./dL 0.2  RBC, UA  negative    Assessment and Plan:     .1.  Pelvic pain (Primary), resolving, but still present intermittently, mild. - ultrasound results are pending - Ibuprofen prn  2. Obesity (BMI 30-39.9) - weight reduction recommended with the aid of dietary changes, exercise and behavioral modification       I discussed the assessment and treatment plan with the patient. The patient was provided an opportunity to ask questions and all were answered. The patient agreed with the plan and demonstrated an understanding of the instructions.   The patient was advised to call back or seek an in-person evaluation/go to the ED if the symptoms worsen or if the condition fails to improve as anticipated.  I provided 10 minutes of face-to-face time during this encounter. I also spent 10 minutes dedicated to the care of this patient including pre-visit review of records, post visit ordering of medications and appropriate tests or procedures, coordinating care and  documenting this visit encounter.    Coral Ceo, MD Center for [facility], [facility], [place] 11/07/2023

## 2023-11-13 ENCOUNTER — Encounter: Payer: Self-pay | Admitting: Obstetrics

## 2023-12-08 ENCOUNTER — Ambulatory Visit (INDEPENDENT_AMBULATORY_CARE_PROVIDER_SITE_OTHER)

## 2023-12-08 ENCOUNTER — Other Ambulatory Visit (HOSPITAL_COMMUNITY)
Admission: RE | Admit: 2023-12-08 | Discharge: 2023-12-08 | Disposition: A | Discharge status: Home / Self Care | Source: Ambulatory Visit | Attending: Obstetrics and Gynecology | Admitting: Obstetrics and Gynecology

## 2023-12-08 VITALS — BP 111/76 | HR 85 | Ht 64.0 in | Wt 227.0 lb

## 2023-12-08 DIAGNOSIS — N898 Other specified noninflammatory disorders of vagina: Secondary | ICD-10-CM | POA: Insufficient documentation

## 2023-12-08 NOTE — Progress Notes (Signed)
 SUBJECTIVE:  23 y.o. female complains of creamy vaginal discharge for 5 day(s). Denies abnormal vaginal bleeding or significant pelvic pain or fever. No UTI symptoms. Denies history of known exposure to STD.  Patient's last menstrual period was 11/26/2023 (exact date).  OBJECTIVE:  She appears well, afebrile. Urine dipstick: not done.  ASSESSMENT:  Vaginal Discharge  Vaginal Odor   PLAN:  GC, chlamydia, trichomonas, BVAG, CVAG probe sent to lab. Treatment: To be determined once lab results are received ROV prn if symptoms persist or worsen.

## 2023-12-09 LAB — CERVICOVAGINAL ANCILLARY ONLY
Bacterial Vaginitis (gardnerella): NEGATIVE
Candida Glabrata: NEGATIVE
Candida Vaginitis: NEGATIVE
Chlamydia: NEGATIVE
Comment: NEGATIVE
Comment: NEGATIVE
Comment: NEGATIVE
Comment: NEGATIVE
Comment: NEGATIVE
Comment: NORMAL
Neisseria Gonorrhea: NEGATIVE
Trichomonas: NEGATIVE

## 2023-12-10 ENCOUNTER — Encounter: Payer: Self-pay | Admitting: Obstetrics

## 2023-12-18 ENCOUNTER — Ambulatory Visit

## 2023-12-22 ENCOUNTER — Ambulatory Visit

## 2023-12-23 ENCOUNTER — Ambulatory Visit

## 2023-12-25 ENCOUNTER — Other Ambulatory Visit (HOSPITAL_COMMUNITY)
Admission: RE | Admit: 2023-12-25 | Discharge: 2023-12-25 | Disposition: A | Discharge status: Home / Self Care | Payer: MEDICAID | Source: Ambulatory Visit | Attending: Obstetrics and Gynecology | Admitting: Obstetrics and Gynecology

## 2023-12-25 ENCOUNTER — Ambulatory Visit: Payer: MEDICAID

## 2023-12-25 VITALS — BP 112/76 | HR 79 | Wt 226.0 lb

## 2023-12-25 DIAGNOSIS — N898 Other specified noninflammatory disorders of vagina: Secondary | ICD-10-CM | POA: Insufficient documentation

## 2023-12-25 DIAGNOSIS — R309 Painful micturition, unspecified: Secondary | ICD-10-CM | POA: Diagnosis not present

## 2023-12-25 LAB — POCT URINALYSIS DIPSTICK
Blood, UA: NEGATIVE
Glucose, UA: NEGATIVE
Nitrite, UA: NEGATIVE
Protein, UA: NEGATIVE
Spec Grav, UA: 1.01 (ref 1.010–1.025)
Urobilinogen, UA: 0.2 U/dL
pH, UA: 7.5 (ref 5.0–8.0)

## 2023-12-25 MED ORDER — NITROFURANTOIN MONOHYD MACRO 100 MG PO CAPS: 100.0000 mg | ORAL_CAPSULE | Freq: Two times a day (BID) | ORAL | 0 refills | Status: DC

## 2023-12-25 MED ORDER — PHENAZOPYRIDINE HCL 200 MG PO TABS: 200.0000 mg | ORAL_TABLET | Freq: Three times a day (TID) | ORAL | 0 refills | Status: AC

## 2023-12-25 NOTE — Progress Notes (Signed)
..  SUBJECTIVE:  23 y.o. female complains of vaginal discharge and odor for that started today. She also complains of urinary frequency, urgency with small amounts of urine, and burning with urination. Denies abnormal vaginal bleeding or significant pelvic pain or fever. Denies history of known exposure to STD.  Patient's last menstrual period was 11/26/2023 (exact date).  OBJECTIVE:  She appears well, afebrile. Urine dipstick: positive for leukocytes and positive for ketones.  ASSESSMENT:  Vaginal Discharge  Vaginal Odor Pain with urination   PLAN:  Urine culture, GC, chlamydia, trichomonas, BVAG, CVAG probe sent to lab. Treatment: To be determined once lab results are received ROV prn if symptoms persist or worsen. Rx sent to pharmacy per protocol due to uti symptoms and dipstick results

## 2023-12-26 LAB — CERVICOVAGINAL ANCILLARY ONLY
Bacterial Vaginitis (gardnerella): POSITIVE — AB
Candida Glabrata: NEGATIVE
Candida Vaginitis: NEGATIVE
Chlamydia: NEGATIVE
Comment: NEGATIVE
Comment: NEGATIVE
Comment: NEGATIVE
Comment: NEGATIVE
Comment: NEGATIVE
Comment: NORMAL
Neisseria Gonorrhea: NEGATIVE
Trichomonas: NEGATIVE

## 2023-12-30 ENCOUNTER — Encounter: Payer: Self-pay | Admitting: Obstetrics

## 2023-12-30 MED ORDER — METRONIDAZOLE 0.75 % VA GEL: 1.0000 | Freq: Every day | VAGINAL | 0 refills | Status: DC

## 2024-02-09 ENCOUNTER — Telehealth: Payer: Self-pay | Admitting: Obstetrics

## 2024-02-09 NOTE — Telephone Encounter (Signed)
 Patient called in asking for another ct she says she's still in pain after being seen at the er

## 2024-03-03 ENCOUNTER — Other Ambulatory Visit (HOSPITAL_COMMUNITY)
Admission: RE | Admit: 2024-03-03 | Discharge: 2024-03-03 | Disposition: A | Discharge status: Home / Self Care | Payer: MEDICAID | Source: Ambulatory Visit | Attending: Obstetrics and Gynecology | Admitting: Obstetrics and Gynecology

## 2024-03-03 ENCOUNTER — Encounter: Payer: Self-pay | Admitting: Obstetrics and Gynecology

## 2024-03-03 ENCOUNTER — Ambulatory Visit: Payer: MEDICAID | Admitting: Obstetrics and Gynecology

## 2024-03-03 VITALS — BP 113/75 | HR 79 | Wt 221.6 lb

## 2024-03-03 DIAGNOSIS — N644 Mastodynia: Secondary | ICD-10-CM

## 2024-03-03 DIAGNOSIS — Z113 Encounter for screening for infections with a predominantly sexual mode of transmission: Secondary | ICD-10-CM | POA: Diagnosis not present

## 2024-03-03 DIAGNOSIS — Z01419 Encounter for gynecological examination (general) (routine) without abnormal findings: Secondary | ICD-10-CM

## 2024-03-03 NOTE — Progress Notes (Signed)
 GYNECOLOGY ANNUAL PREVENTATIVE CARE ENCOUNTER NOTE  History:     Briana Curry is a 23 y.o. [phone] female here for a routine annual gynecologic exam.  Current complaints: left breast pain x 1 year.   Denies abnormal vaginal bleeding, discharge, pelvic pain, problems with intercourse or other gynecologic concerns.    Gynecologic History Patient's last menstrual period was 02/09/2024. Contraception: none Last Pap: 09/23/22. Results were: normal with negative HPV Last mammogram:n/a  Obstetric History OB History  Gravida Para Term Preterm AB Living  2 2 2   2   SAB IAB Ectopic Multiple Live Births     0 2    # Outcome Date GA Lbr Len/2nd Weight Sex Type Anes PTL Lv  2 Term 01/09/22 [redacted]w[redacted]d / 00:11 8 lb 8.9 oz (3.88 kg) F Vag-Spont EPI  LIV  1 Term 04/07/20 [redacted]w[redacted]d / 01:29 8 lb 12.2 oz (3.975 kg) M Vag-Spont EPI  LIV    Past Medical History:  Diagnosis Date   Anemia    Asthma    Broken arm    Chlamydia infection affecting pregnancy 11/21/2021   Tx 11/20/2021     Encounter for supervision of normal pregnancy, antepartum 05/29/2021              Nursing Staff    Provider      Office Location     [facility]    Dating     LMP      Language     English    Anatomy US             Flu Vaccine     Declined 05/29/21    Genetic/Carrier Screen     NIPS:   negative  AFP:  to late  Horizon: negative      TDaP Vaccine      Declined 10/17/21    Hgb A1C or   GTT    Early   Third trimester       COVID Vaccine    Completed         LAB RESULTS        Iron  deficiency    Multiple allergies    Post-dates pregnancy 01/08/2022    Past Surgical History:  Procedure Laterality Date   ARM HARDWARE REMOVAL      Current Outpatient Medications on File Prior to Visit  Medication Sig Dispense Refill   ibuprofen  (ADVIL ) 800 MG tablet Take 1 tablet (800 mg total) by mouth every 8 (eight) hours as needed (pain). 21 tablet 0   No current facility-administered medications on file prior to visit.    Allergies   Allergen Reactions   Lactose Intolerance (Gi) Diarrhea   Other Rash    [employer] sweet and sour sauce caused rash and seafood    Social History:  reports that she quit smoking about 2 years ago. Her smoking use included cigars. She has never been exposed to tobacco smoke. She has never used smokeless tobacco. She reports current alcohol use. She reports that she does not use drugs.  Family History  Problem Relation Age of Onset   Asthma Mother    Asthma Sister    Eczema Sister    Urticaria Sister    Asthma Brother    Eczema Brother     The following portions of the patient's history were reviewed and updated as appropriate: allergies, current medications, past family history, past medical history, past social history, past surgical history and problem list.  Review of Systems Pertinent  items noted in HPI and remainder of comprehensive ROS otherwise negative.  Physical Exam:  BP 113/75   Pulse 79   Wt 221 lb 9.6 oz (100.5 kg)   LMP 02/09/2024   BMI 38.04 kg/m  CONSTITUTIONAL: Well-developed, well-nourished female in no acute distress.  HENT:  Normocephalic, atraumatic, External right and left ear normal. Oropharynx is clear and moist EYES: Conjunctivae and EOM are normal.  NECK: Normal range of motion, supple, no masses.  Normal thyroid .  SKIN: Skin is warm and dry. No rash noted. Not diaphoretic. No erythema. No pallor. MUSCULOSKELETAL: Normal range of motion. No tenderness.  No cyanosis, clubbing, or edema.  2+ distal pulses. NEUROLOGIC: Alert and oriented to person, place, and time. Normal reflexes, muscle tone coordination.  PSYCHIATRIC: Normal mood and affect. Normal behavior. Normal judgment and thought content. CARDIOVASCULAR: Normal heart rate noted, regular rhythm RESPIRATORY: Clear to auscultation bilaterally. Effort and breath sounds normal, no problems with respiration noted. BREASTS: Symmetric in size. Bilateral breasts have typical lumpy, bumpy texture of  fibrocystic breasts, mild left breast tenderness on periphery ABDOMEN: Soft, no distention noted.  No tenderness, rebound or guarding.  PELVIC: Normal appearing external genitalia and urethral meatus; normal appearing vaginal mucosa and cervix.  No abnormal discharge noted.  Pap smear obtained.  Normal uterine size, no other palpable masses, no uterine or adnexal tenderness.  Performed in the presence of a chaperone.   Assessment and Plan:    1. Women's annual routine gynecological examination (Primary) Normal annual exam Pt requested CMP to eval liver and kindneys - Comprehensive metabolic panel with GFR  2. Routine screening for STI (sexually transmitted infection) Per pt request  - Cervicovaginal ancillary only( [place]) - RPR - Hepatitis C Antibody - Hepatitis B Surface AntiGEN - HIV antibody (with reflex)  3. Breast pain in female Will check left breast ultrasound, possible fibrocystic breasts - US  LIMITED ULTRASOUND INCLUDING AXILLA LEFT BREAST ; Future  Will follow up results of pap smear and manage accordingly. Routine preventative health maintenance measures emphasized. Please refer to After Visit Summary for other counseling recommendations.      Jerilynn Buddle, MD, FACOG Obstetrician & Gynecologist, [facility] for [facility], [facility]

## 2024-03-03 NOTE — Progress Notes (Signed)
 Pt is in the office for annual Last pap 09/23/2022 LMP 02/09/24 Desires std testing, reports vaginal discharge today. Pt reports intermittent Left breast pain that started almost a year ago.

## 2024-03-04 LAB — COMPREHENSIVE METABOLIC PANEL WITH GFR
ALT: 33 IU/L — ABNORMAL HIGH (ref 0–32)
AST: 27 IU/L (ref 0–40)
Albumin: 4.5 g/dL (ref 4.0–5.0)
Alkaline Phosphatase: 87 IU/L (ref 44–121)
BUN/Creatinine Ratio: 14 (ref 9–23)
BUN: 10 mg/dL (ref 6–20)
Bilirubin Total: 0.4 mg/dL (ref 0.0–1.2)
CO2: 21 mmol/L (ref 20–29)
Calcium: 9.6 mg/dL (ref 8.7–10.2)
Chloride: 103 mmol/L (ref 96–106)
Creatinine, Ser: 0.71 mg/dL (ref 0.57–1.00)
Globulin, Total: 3 g/dL (ref 1.5–4.5)
Glucose: 93 mg/dL (ref 70–99)
Potassium: 4 mmol/L (ref 3.5–5.2)
Sodium: 139 mmol/L (ref 134–144)
Total Protein: 7.5 g/dL (ref 6.0–8.5)
eGFR: 123 mL/min/1.73 (ref >59)

## 2024-03-04 LAB — CERVICOVAGINAL ANCILLARY ONLY
Bacterial Vaginitis (gardnerella): NEGATIVE
Candida Glabrata: NEGATIVE
Candida Vaginitis: NEGATIVE
Chlamydia: NEGATIVE
Comment: NEGATIVE
Comment: NEGATIVE
Comment: NEGATIVE
Comment: NEGATIVE
Comment: NEGATIVE
Comment: NORMAL
Neisseria Gonorrhea: NEGATIVE
Trichomonas: NEGATIVE

## 2024-03-04 LAB — RPR: RPR Ser Ql: NONREACTIVE

## 2024-03-04 LAB — HEPATITIS B SURFACE ANTIGEN: Hepatitis B Surface Ag: NEGATIVE

## 2024-03-04 LAB — HEPATITIS C ANTIBODY: Hep C Virus Ab: NONREACTIVE

## 2024-03-04 LAB — HIV ANTIBODY (ROUTINE TESTING W REFLEX): HIV Screen 4th Generation wRfx: NONREACTIVE

## 2024-03-09 ENCOUNTER — Ambulatory Visit: Payer: Self-pay | Admitting: Obstetrics and Gynecology

## 2024-03-11 ENCOUNTER — Other Ambulatory Visit: Payer: MEDICAID

## 2024-03-18 ENCOUNTER — Ambulatory Visit
Admission: RE | Admit: 2024-03-18 | Discharge: 2024-03-18 | Disposition: A | Discharge status: Home / Self Care | Payer: MEDICAID | Source: Ambulatory Visit | Attending: Obstetrics and Gynecology | Admitting: Obstetrics and Gynecology

## 2024-03-18 DIAGNOSIS — N644 Mastodynia: Secondary | ICD-10-CM

## 2024-06-03 ENCOUNTER — Telehealth: Payer: Self-pay

## 2024-06-03 NOTE — Telephone Encounter (Signed)
 Returned call, pt wanting refill on medication for uti. Advised will have to make appt for urine sample, pt said that she will call back.

## 2024-06-07 ENCOUNTER — Ambulatory Visit: Payer: MEDICAID

## 2024-09-20 ENCOUNTER — Ambulatory Visit: Payer: Self-pay

## 2024-09-23 ENCOUNTER — Ambulatory Visit: Payer: MEDICAID
